# Patient Record
Sex: Male | Born: 1960 | Race: White | Hispanic: No | Marital: Married | Smoking: Never smoker
Health system: Southern US, Community
[De-identification: ages and names within clinical notes are randomized; demographics above are authoritative.]

## PROBLEM LIST (undated history)

## (undated) DIAGNOSIS — R1011 Right upper quadrant pain: Secondary | ICD-10-CM

## (undated) DIAGNOSIS — E669 Obesity, unspecified: Secondary | ICD-10-CM

## (undated) DIAGNOSIS — I8390 Asymptomatic varicose veins of unspecified lower extremity: Principal | ICD-10-CM

## (undated) DIAGNOSIS — B019 Varicella without complication: Secondary | ICD-10-CM

## (undated) DIAGNOSIS — Z8619 Personal history of other infectious and parasitic diseases: Secondary | ICD-10-CM

## (undated) DIAGNOSIS — E785 Hyperlipidemia, unspecified: Secondary | ICD-10-CM

## (undated) DIAGNOSIS — Z0000 Encounter for general adult medical examination without abnormal findings: Secondary | ICD-10-CM

## (undated) HISTORY — DX: Asymptomatic varicose veins of unspecified lower extremity: I83.90

## (undated) HISTORY — DX: Personal history of other infectious and parasitic diseases: Z86.19

## (undated) HISTORY — DX: Hyperlipidemia, unspecified: E78.5

## (undated) HISTORY — DX: Right upper quadrant pain: R10.11

## (undated) HISTORY — DX: Encounter for general adult medical examination without abnormal findings: Z00.00

## (undated) HISTORY — DX: Obesity, unspecified: E66.9

## (undated) HISTORY — DX: Varicella without complication: B01.9

---
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
## 1987-02-28 HISTORY — PX: WISDOM TOOTH EXTRACTION: SHX21

## 2013-12-23 ENCOUNTER — Encounter: Payer: Self-pay | Admitting: Family Medicine

## 2013-12-23 ENCOUNTER — Ambulatory Visit (INDEPENDENT_AMBULATORY_CARE_PROVIDER_SITE_OTHER): Payer: 59 | Admitting: Family Medicine

## 2013-12-23 VITALS — BP 116/63 | HR 55 | Temp 98.2°F | Ht 76.0 in | Wt 261.2 lb

## 2013-12-23 DIAGNOSIS — R1011 Right upper quadrant pain: Secondary | ICD-10-CM

## 2013-12-23 DIAGNOSIS — I8390 Asymptomatic varicose veins of unspecified lower extremity: Secondary | ICD-10-CM

## 2013-12-23 DIAGNOSIS — E785 Hyperlipidemia, unspecified: Secondary | ICD-10-CM

## 2013-12-23 DIAGNOSIS — Z1211 Encounter for screening for malignant neoplasm of colon: Secondary | ICD-10-CM

## 2013-12-23 DIAGNOSIS — Z8619 Personal history of other infectious and parasitic diseases: Secondary | ICD-10-CM | POA: Insufficient documentation

## 2013-12-23 DIAGNOSIS — Z832 Family history of diseases of the blood and blood-forming organs and certain disorders involving the immune mechanism: Secondary | ICD-10-CM

## 2013-12-23 DIAGNOSIS — Z0000 Encounter for general adult medical examination without abnormal findings: Secondary | ICD-10-CM

## 2013-12-23 DIAGNOSIS — I868 Varicose veins of other specified sites: Secondary | ICD-10-CM

## 2013-12-23 DIAGNOSIS — E669 Obesity, unspecified: Secondary | ICD-10-CM

## 2013-12-23 DIAGNOSIS — R7989 Other specified abnormal findings of blood chemistry: Secondary | ICD-10-CM

## 2013-12-23 DIAGNOSIS — R946 Abnormal results of thyroid function studies: Secondary | ICD-10-CM

## 2013-12-23 HISTORY — DX: Asymptomatic varicose veins of unspecified lower extremity: I83.90

## 2013-12-23 HISTORY — DX: Obesity, unspecified: E66.9

## 2013-12-23 HISTORY — DX: Right upper quadrant pain: R10.11

## 2013-12-23 HISTORY — DX: Hyperlipidemia, unspecified: E78.5

## 2013-12-23 HISTORY — DX: Encounter for general adult medical examination without abnormal findings: Z00.00

## 2013-12-23 LAB — TSH: TSH: 2.48 u[IU]/mL (ref 0.35–4.50)

## 2013-12-23 LAB — T4, FREE: Free T4: 1.01 ng/dL (ref 0.60–1.60)

## 2013-12-23 NOTE — Patient Instructions (Signed)
[place]  Call if abdominal pain worsens  Preventive Care for Adults A healthy lifestyle and preventive care can promote health and wellness. Preventive health guidelines for men include the following key practices:  A routine yearly physical is a good way to check with your health care provider about your health and preventative screening. It is a chance to share any concerns and updates on your health and to receive a thorough exam.  Visit your dentist for a routine exam and preventative care every 6 months. Brush your teeth twice a day and floss once a day. Good oral hygiene prevents tooth decay and gum disease.  The frequency of eye exams is based on your age, health, family medical history, use of contact lenses, and other factors. Follow your health care provider's recommendations for frequency of eye exams.  Eat a healthy diet. Foods such as vegetables, fruits, whole grains, low-fat dairy products, and lean protein foods contain the nutrients you need without too many calories. Decrease your intake of foods high in solid fats, added sugars, and salt. Eat the right amount of calories for you.Get information about a proper diet from your health care provider, if necessary.  Regular physical exercise is one of the most important things you can do for your health. Most adults should get at least 150 minutes of moderate-intensity exercise (any activity that increases your heart rate and causes you to sweat) each week. In addition, most adults need muscle-strengthening exercises on 2 or more days a week.  Maintain a healthy weight. The body mass index (BMI) is a screening tool to identify possible weight problems. It provides an estimate of body fat based on height and weight. Your health care provider can find your BMI and can help you achieve or maintain a healthy weight.For adults 20 years and older:  A BMI below 18.5 is considered underweight.  A BMI of 18.5 to 24.9 is  normal.  A BMI of 25 to 29.9 is considered overweight.  A BMI of 30 and above is considered obese.  Maintain normal blood lipids and cholesterol levels by exercising and minimizing your intake of saturated fat. Eat a balanced diet with plenty of fruit and vegetables. Blood tests for lipids and cholesterol should begin at age 60 and be repeated every 5 years. If your lipid or cholesterol levels are high, you are over 50, or you are at high risk for heart disease, you may need your cholesterol levels checked more frequently.Ongoing high lipid and cholesterol levels should be treated with medicines if diet and exercise are not working.  If you smoke, find out from your health care provider how to quit. If you do not use tobacco, do not start.  Lung cancer screening is recommended for adults aged 62-80 years who are at high risk for developing lung cancer because of a history of smoking. A yearly low-dose CT scan of the lungs is recommended for people who have at least a 30-pack-year history of smoking and are a current smoker or have quit within the past 15 years. A pack year of smoking is smoking an average of 1 pack of cigarettes a day for 1 year (for example: 1 pack a day for 30 years or 2 packs a day for 15 years). Yearly screening should continue until the smoker has stopped smoking for at least 15 years. Yearly screening should be stopped for people who develop a health problem that would prevent them from having lung cancer treatment.  If  you choose to drink alcohol, do not have more than 2 drinks per day. One drink is considered to be 12 ounces (355 mL) of beer, 5 ounces (148 mL) of wine, or 1.5 ounces (44 mL) of liquor.  Avoid use of street drugs. Do not share needles with anyone. Ask for help if you need support or instructions about stopping the use of drugs.  High blood pressure causes heart disease and increases the risk of stroke. Your blood pressure should be checked at least every 1-2  years. Ongoing high blood pressure should be treated with medicines, if weight loss and exercise are not effective.  If you are 21-2 years old, ask your health care provider if you should take aspirin to prevent heart disease.  Diabetes screening involves taking a blood sample to check your fasting blood sugar level. This should be done once every 3 years, after age 59, if you are within normal weight and without risk factors for diabetes. Testing should be considered at a younger age or be carried out more frequently if you are overweight and have at least 1 risk factor for diabetes.  Colorectal cancer can be detected and often prevented. Most routine colorectal cancer screening begins at the age of 11 and continues through age 37. However, your health care provider may recommend screening at an earlier age if you have risk factors for colon cancer. On a yearly basis, your health care provider may provide home test kits to check for hidden blood in the stool. Use of a small camera at the end of a tube to directly examine the colon (sigmoidoscopy or colonoscopy) can detect the earliest forms of colorectal cancer. Talk to your health care provider about this at age 86, when routine screening begins. Direct exam of the colon should be repeated every 5-10 years through age 66, unless early forms of precancerous polyps or small growths are found.  People who are at an increased risk for hepatitis B should be screened for this virus. You are considered at high risk for hepatitis B if:  You were born in a country where hepatitis B occurs often. Talk with your health care provider about which countries are considered high risk.  Your parents were born in a high-risk country and you have not received a shot to protect against hepatitis B (hepatitis B vaccine).  You have HIV or AIDS.  You use needles to inject street drugs.  You live with, or have sex with, someone who has hepatitis B.  You are a man who  has sex with other men (MSM).  You get hemodialysis treatment.  You take certain medicines for conditions such as cancer, organ transplantation, and autoimmune conditions.  Hepatitis C blood testing is recommended for all people born from [birth year] through 1965 and any individual with known risks for hepatitis C.  Practice safe sex. Use condoms and avoid high-risk sexual practices to reduce the spread of sexually transmitted infections (STIs). STIs include gonorrhea, chlamydia, syphilis, trichomonas, herpes, HPV, and human immunodeficiency virus (HIV). Herpes, HIV, and HPV are viral illnesses that have no cure. They can result in disability, cancer, and death.  If you are at risk of being infected with HIV, it is recommended that you take a prescription medicine daily to prevent HIV infection. This is called preexposure prophylaxis (PrEP). You are considered at risk if:  You are a man who has sex with other men (MSM) and have other risk factors.  You are a heterosexual man, are  sexually active, and are at increased risk for HIV infection.  You take drugs by injection.  You are sexually active with a partner who has HIV.  Talk with your health care provider about whether you are at high risk of being infected with HIV. If you choose to begin PrEP, you should first be tested for HIV. You should then be tested every 3 months for as long as you are taking PrEP.  A one-time screening for abdominal aortic aneurysm (AAA) and surgical repair of large AAAs by ultrasound are recommended for men ages 58 to 32 years who are current or former smokers.  Healthy men should no longer receive prostate-specific antigen (PSA) blood tests as part of routine cancer screening. Talk with your health care provider about prostate cancer screening.  Testicular cancer screening is not recommended for adult males who have no symptoms. Screening includes self-exam, a health care provider exam, and other screening tests.  Consult with your health care provider about any symptoms you have or any concerns you have about testicular cancer.  Use sunscreen. Apply sunscreen liberally and repeatedly throughout the day. You should seek shade when your shadow is shorter than you. Protect yourself by wearing long sleeves, pants, a wide-brimmed hat, and sunglasses year round, whenever you are outdoors.  Once a month, do a whole-body skin exam, using a mirror to look at the skin on your back. Tell your health care provider about new moles, moles that have irregular borders, moles that are larger than a pencil eraser, or moles that have changed in shape or color.  Stay current with required vaccines (immunizations).  Influenza vaccine. All adults should be immunized every year.  Tetanus, diphtheria, and acellular pertussis (Td, Tdap) vaccine. An adult who has not previously received Tdap or who does not know his vaccine status should receive 1 dose of Tdap. This initial dose should be followed by tetanus and diphtheria toxoids (Td) booster doses every 10 years. Adults with an unknown or incomplete history of completing a 3-dose immunization series with Td-containing vaccines should begin or complete a primary immunization series including a Tdap dose. Adults should receive a Td booster every 10 years.  Varicella vaccine. An adult without evidence of immunity to varicella should receive 2 doses or a second dose if he has previously received 1 dose.  Human papillomavirus (HPV) vaccine. Males aged 49-21 years who have not received the vaccine previously should receive the 3-dose series. Males aged 22-26 years may be immunized. Immunization is recommended through the age of 57 years for any male who has sex with males and did not get any or all doses earlier. Immunization is recommended for any person with an immunocompromised condition through the age of 70 years if he did not get any or all doses earlier. During the 3-dose series, the  second dose should be obtained 4-8 weeks after the first dose. The third dose should be obtained 24 weeks after the first dose and 16 weeks after the second dose.  Zoster vaccine. One dose is recommended for adults aged 23 years or older unless certain conditions are present.  Measles, mumps, and rubella (MMR) vaccine. Adults born before [birth year] generally are considered immune to measles and mumps. Adults born in [birth year] or later should have 1 or more doses of MMR vaccine unless there is a contraindication to the vaccine or there is laboratory evidence of immunity to each of the three diseases. A routine second dose of MMR vaccine should be obtained at  least 28 days after the first dose for students attending postsecondary schools, health care workers, or international travelers. People who received inactivated measles vaccine or an unknown type of measles vaccine during 1963-1967 should receive 2 doses of MMR vaccine. People who received inactivated mumps vaccine or an unknown type of mumps vaccine before 1979 and are at high risk for mumps infection should consider immunization with 2 doses of MMR vaccine. Unvaccinated health care workers born before [birth year] who lack laboratory evidence of measles, mumps, or rubella immunity or laboratory confirmation of disease should consider measles and mumps immunization with 2 doses of MMR vaccine or rubella immunization with 1 dose of MMR vaccine.  Pneumococcal 13-valent conjugate (PCV13) vaccine. When indicated, a person who is uncertain of his immunization history and has no record of immunization should receive the PCV13 vaccine. An adult aged 52 years or older who has certain medical conditions and has not been previously immunized should receive 1 dose of PCV13 vaccine. This PCV13 should be followed with a dose of pneumococcal polysaccharide (PPSV23) vaccine. The PPSV23 vaccine dose should be obtained at least 8 weeks after the dose of PCV13 vaccine. An adult aged 74 years  or older who has certain medical conditions and previously received 1 or more doses of PPSV23 vaccine should receive 1 dose of PCV13. The PCV13 vaccine dose should be obtained 1 or more years after the last PPSV23 vaccine dose.  Pneumococcal polysaccharide (PPSV23) vaccine. When PCV13 is also indicated, PCV13 should be obtained first. All adults aged 104 years and older should be immunized. An adult younger than age 38 years who has certain medical conditions should be immunized. Any person who resides in a nursing home or long-term care facility should be immunized. An adult smoker should be immunized. People with an immunocompromised condition and certain other conditions should receive both PCV13 and PPSV23 vaccines. People with human immunodeficiency virus (HIV) infection should be immunized as soon as possible after diagnosis. Immunization during chemotherapy or radiation therapy should be avoided. Routine use of PPSV23 vaccine is not recommended for American Indians, [place] Natives, or people younger than 65 years unless there are medical conditions that require PPSV23 vaccine. When indicated, people who have unknown immunization and have no record of immunization should receive PPSV23 vaccine. One-time revaccination 5 years after the first dose of PPSV23 is recommended for people aged 19-64 years who have chronic kidney failure, nephrotic syndrome, asplenia, or immunocompromised conditions. People who received 1-2 doses of PPSV23 before age 56 years should receive another dose of PPSV23 vaccine at age 29 years or later if at least 5 years have passed since the previous dose. Doses of PPSV23 are not needed for people immunized with PPSV23 at or after age 12 years.  Meningococcal vaccine. Adults with asplenia or persistent complement component deficiencies should receive 2 doses of quadrivalent meningococcal conjugate (MenACWY-D) vaccine. The doses should be obtained at least 2 months apart. Microbiologists  working with certain meningococcal bacteria, [employer] recruits, people at risk during an outbreak, and people who travel to or live in countries with a high rate of meningitis should be immunized. A first-year college student up through age 83 years who is living in a residence hall should receive a dose if he did not receive a dose on or after his 16th birthday. Adults who have certain high-risk conditions should receive one or more doses of vaccine.  Hepatitis A vaccine. Adults who wish to be protected from this disease, have certain high-risk conditions, work  with hepatitis A-infected animals, work in hepatitis A research labs, or travel to or work in countries with a high rate of hepatitis A should be immunized. Adults who were previously unvaccinated and who anticipate close contact with an international adoptee during the first 60 days after arrival in the [place] from a country with a high rate of hepatitis A should be immunized.  Hepatitis B vaccine. Adults should be immunized if they wish to be protected from this disease, have certain high-risk conditions, may be exposed to blood or other infectious body fluids, are household contacts or sex partners of hepatitis B positive people, are clients or workers in certain care facilities, or travel to or work in countries with a high rate of hepatitis B.  Haemophilus influenzae type b (Hib) vaccine. A previously unvaccinated person with asplenia or sickle cell disease or having a scheduled splenectomy should receive 1 dose of Hib vaccine. Regardless of previous immunization, a recipient of a hematopoietic stem cell transplant should receive a 3-dose series 6-12 months after his successful transplant. Hib vaccine is not recommended for adults with HIV infection. Preventive Service / Frequency Ages 59 to 29  Blood pressure check.** / Every 1 to 2 years.  Lipid and cholesterol check.** / Every 5 years beginning at age 34.  Hepatitis C blood  test.** / For any individual with known risks for hepatitis C.  Skin self-exam. / Monthly.  Influenza vaccine. / Every year.  Tetanus, diphtheria, and acellular pertussis (Tdap, Td) vaccine.** / Consult your health care provider. 1 dose of Td every 10 years.  Varicella vaccine.** / Consult your health care provider.  HPV vaccine. / 3 doses over 6 months, if 45 or younger.  Measles, mumps, rubella (MMR) vaccine.** / You need at least 1 dose of MMR if you were born in 1957 or later. You may also need a second dose.  Pneumococcal 13-valent conjugate (PCV13) vaccine.** / Consult your health care provider.  Pneumococcal polysaccharide (PPSV23) vaccine.** / 1 to 2 doses if you smoke cigarettes or if you have certain conditions.  Meningococcal vaccine.** / 1 dose if you are age 50 to 64 years and a Market researcher living in a residence hall, or have one of several medical conditions. You may also need additional booster doses.  Hepatitis A vaccine.** / Consult your health care provider.  Hepatitis B vaccine.** / Consult your health care provider.  Haemophilus influenzae type b (Hib) vaccine.** / Consult your health care provider. Ages 30 to 32  Blood pressure check.** / Every 1 to 2 years.  Lipid and cholesterol check.** / Every 5 years beginning at age 69.  Lung cancer screening. / Every year if you are aged 66-80 years and have a 30-pack-year history of smoking and currently smoke or have quit within the past 15 years. Yearly screening is stopped once you have quit smoking for at least 15 years or develop a health problem that would prevent you from having lung cancer treatment.  Fecal occult blood test (FOBT) of stool. / Every year beginning at age 89 and continuing until age 48. You may not have to do this test if you get a colonoscopy every 10 years.  Flexible sigmoidoscopy** or colonoscopy.** / Every 5 years for a flexible sigmoidoscopy or every 10 years for a colonoscopy  beginning at age 20 and continuing until age 55.  Hepatitis C blood test.** / For all people born from [birth year] through 1965 and any individual with known risks for hepatitis  C.  Skin self-exam. / Monthly.  Influenza vaccine. / Every year.  Tetanus, diphtheria, and acellular pertussis (Tdap/Td) vaccine.** / Consult your health care provider. 1 dose of Td every 10 years.  Varicella vaccine.** / Consult your health care provider.  Zoster vaccine.** / 1 dose for adults aged 107 years or older.  Measles, mumps, rubella (MMR) vaccine.** / You need at least 1 dose of MMR if you were born in 1955 or later. You may also need a second dose.  Pneumococcal 13-valent conjugate (PCV13) vaccine.** / Consult your health care provider.  Pneumococcal polysaccharide (PPSV23) vaccine.** / 1 to 2 doses if you smoke cigarettes or if you have certain conditions.  Meningococcal vaccine.** / Consult your health care provider.  Hepatitis A vaccine.** / Consult your health care provider.  Hepatitis B vaccine.** / Consult your health care provider.  Haemophilus influenzae type b (Hib) vaccine.** / Consult your health care provider. Ages 50 and over  Blood pressure check.** / Every 1 to 2 years.  Lipid and cholesterol check.**/ Every 5 years beginning at age 31.  Lung cancer screening. / Every year if you are aged 28-80 years and have a 30-pack-year history of smoking and currently smoke or have quit within the past 15 years. Yearly screening is stopped once you have quit smoking for at least 15 years or develop a health problem that would prevent you from having lung cancer treatment.  Fecal occult blood test (FOBT) of stool. / Every year beginning at age 82 and continuing until age 59. You may not have to do this test if you get a colonoscopy every 10 years.  Flexible sigmoidoscopy** or colonoscopy.** / Every 5 years for a flexible sigmoidoscopy or every 10 years for a colonoscopy beginning at age 68 and  continuing until age 72.  Hepatitis C blood test.** / For all people born from [birth year] through 1963 and any individual with known risks for hepatitis C.  Abdominal aortic aneurysm (AAA) screening.** / A one-time screening for ages 75 to 3 years who are current or former smokers.  Skin self-exam. / Monthly.  Influenza vaccine. / Every year.  Tetanus, diphtheria, and acellular pertussis (Tdap/Td) vaccine.** / 1 dose of Td every 10 years.  Varicella vaccine.** / Consult your health care provider.  Zoster vaccine.** / 1 dose for adults aged 28 years or older.  Pneumococcal 13-valent conjugate (PCV13) vaccine.** / Consult your health care provider.  Pneumococcal polysaccharide (PPSV23) vaccine.** / 1 dose for all adults aged 29 years and older.  Meningococcal vaccine.** / Consult your health care provider.  Hepatitis A vaccine.** / Consult your health care provider.  Hepatitis B vaccine.** / Consult your health care provider.  Haemophilus influenzae type b (Hib) vaccine.** / Consult your health care provider. **Family history and personal history of risk and conditions may change your health care provider's recommendations. Document Released: 04/11/2001 Document Revised: 02/18/2013 Document Reviewed: 07/11/2010 [facility] Patient Information 2015 [place], [place]. This information is not intended to replace advice given to you by your health care provider. Make sure you discuss any questions you have with your health care provider.

## 2013-12-23 NOTE — Progress Notes (Signed)
Pre visit review using our clinic review tool, if applicable. No additional management support is needed unless otherwise documented below in the visit note. 

## 2013-12-26 ENCOUNTER — Ambulatory Visit (HOSPITAL_BASED_OUTPATIENT_CLINIC_OR_DEPARTMENT_OTHER)
Admission: RE | Admit: 2013-12-26 | Discharge: 2013-12-26 | Disposition: A | Discharge status: Home / Self Care | Payer: 59 | Source: Ambulatory Visit | Attending: Radiology | Admitting: Radiology

## 2013-12-26 ENCOUNTER — Telehealth: Payer: Self-pay

## 2013-12-26 DIAGNOSIS — R1011 Right upper quadrant pain: Secondary | ICD-10-CM | POA: Insufficient documentation

## 2013-12-26 LAB — FACTOR 5 LEIDEN

## 2013-12-26 NOTE — Telephone Encounter (Signed)
Message copied by Court JoyFREEMAN, Arlan Birks L on Fri Dec 26, 2013  3:27 PM ------      Message from: Lillia AbedSHIVES, DUSTIN T      Created: Fri Dec 26, 2013  3:25 PM       Did you call patient about US results?  ------

## 2013-12-26 NOTE — Telephone Encounter (Signed)
i will notify pt of results

## 2013-12-28 DIAGNOSIS — Z832 Family history of diseases of the blood and blood-forming organs and certain disorders involving the immune mechanism: Secondary | ICD-10-CM | POA: Insufficient documentation

## 2013-12-28 DIAGNOSIS — R7989 Other specified abnormal findings of blood chemistry: Secondary | ICD-10-CM | POA: Insufficient documentation

## 2013-12-28 DIAGNOSIS — Z1211 Encounter for screening for malignant neoplasm of colon: Secondary | ICD-10-CM | POA: Insufficient documentation

## 2013-12-28 NOTE — Progress Notes (Signed)
Elise BenneJohn C Morell [id number] 08/28/1960 12/28/2013      Progress Note-Follow Up  Subjective  Chief Complaint  Chief Complaint  Patient presents with  . Establish Care    new patient    HPI  Patient is a 53 year old male in today for routine medical care. In today to establish care. Is noting recent history of sharp RUQ pain after eating rich food. No n/v. No other acute c/o but is interested in getting tested for factor V Leiden def due to family history. Denies CP/palp/SOB/HA/congestion/fevers/GI or GU c/o. Taking meds as prescribed  Past Medical History  Diagnosis Date  . Chicken pox as a child  . Varicose veins 12/23/2013  . RUQ pain 12/23/2013  . Obesity 12/23/2013  . Hyperlipidemia 12/23/2013  . Preventative health care 12/23/2013    Past Surgical History  Procedure Laterality Date  . Appendectomy  1979  . Tonsillectomy  1969  . Wisdom tooth extraction  1989    Family History  Problem Relation Age of Onset  . Heart disease Mother   . Stroke Mother   . Arthritis Mother   . Factor V Leiden deficiency Mother   . Cancer Father     prostate  . Arthritis Father   . Factor V Leiden deficiency Sister   . Factor V Leiden deficiency Brother   . Deep vein thrombosis Brother   . Parkinson's disease Paternal Grandmother   . Factor V Leiden deficiency Sister   . Drug abuse Brother   . Factor V Leiden deficiency Brother   . Deep vein thrombosis Brother   . Deep vein thrombosis Brother   . Learning disabilities Maternal Aunt     History   Social History  . Marital Status: Married    Spouse Name: N/A    Number of Children: N/A  . Years of Education: N/A   Occupational History  . Not on file.   Social History Main Topics  . Smoking status: Never Smoker   . Smokeless tobacco: Never Used  . Alcohol Use: Yes     Comment: 6 beers weekly  . Drug Use: No  . Sexual Activity:    Partners: Female     Comment: lives with wife, works as an Merchandiser, retailnvestment Consultant, no  dietary restrictions   Other Topics Concern  . Not on file   Social History Narrative  . No narrative on file    No current outpatient prescriptions on file prior to visit.   No current facility-administered medications on file prior to visit.    No Known Allergies  Review of Systems  Review of Systems  Constitutional: Negative for fever and malaise/fatigue.  HENT: Negative for congestion.   Eyes: Negative for discharge.  Respiratory: Negative for shortness of breath.   Cardiovascular: Negative for chest pain, palpitations and leg swelling.  Gastrointestinal: Negative for nausea, abdominal pain and diarrhea.  Genitourinary: Negative for dysuria.  Musculoskeletal: Negative for falls.  Skin: Negative for rash.  Neurological: Negative for loss of consciousness and headaches.  Endo/Heme/Allergies: Negative for polydipsia.  Psychiatric/Behavioral: Negative for depression and suicidal ideas. The patient is not nervous/anxious and does not have insomnia.     Objective  BP 116/63 mmHg  Pulse 55  Temp(Src) 98.2 F (36.8 C) (Oral)  Ht 6\' 4"  (1.93 m)  Wt 261 lb 3.2 oz (118.48 kg)  BMI 31.81 kg/m2  SpO2 100%  Physical Exam  Physical Exam  Constitutional: He is oriented to person, place, and time and well-developed, well-nourished,  and in no distress. No distress.  HENT:  Head: Normocephalic and atraumatic.  Eyes: Conjunctivae are normal.  Neck: Neck supple. No thyromegaly present.  Cardiovascular: Normal rate, regular rhythm and normal heart sounds.   No murmur heard. Pulmonary/Chest: Effort normal and breath sounds normal. No respiratory distress.  Abdominal: He exhibits no distension and no mass. There is no tenderness.  Musculoskeletal: He exhibits no edema.  Neurological: He is alert and oriented to person, place, and time.  Skin: Skin is warm.  Psychiatric: Memory, affect and judgment normal.    Lab Results  Component Value Date   TSH 2.48 12/23/2013       Assessment & Plan  Obesity Encouraged DASH diet, decrease po intake and increase exercise as tolerated. Needs 7-8 hours of sleep nightly. Avoid trans fats, eat small, frequent meals every 4-5 hours with lean proteins, complex carbs and healthy fats. Minimize simple carbs, GMO foods.  FH: factor V Leiden mutation Patient tested at visit, negative  Hyperlipidemia Encouraged heart healthy diet, increase exercise, avoid trans fats, consider a krill oil cap daily  Varicose veins Largely asymptomatic, encouraged compression hose.   Screen for colon cancer Referred for screening colonoscopy  Abnormal thyroid blood test Asymptomatic will monitor  RUQ pain After rich meals, off and on for months. Ultrasound unremarkable if continues will need referral for further evaluation

## 2013-12-28 NOTE — Assessment & Plan Note (Signed)
Encouraged heart healthy diet, increase exercise, avoid trans fats, consider a krill oil cap daily 

## 2013-12-28 NOTE — Assessment & Plan Note (Signed)
Patient tested at visit, negative

## 2013-12-28 NOTE — Assessment & Plan Note (Signed)
Referred for screening colonoscopy 

## 2013-12-28 NOTE — Assessment & Plan Note (Signed)
Encouraged DASH diet, decrease po intake and increase exercise as tolerated. Needs 7-8 hours of sleep nightly. Avoid trans fats, eat small, frequent meals every 4-5 hours with lean proteins, complex carbs and healthy fats. Minimize simple carbs, GMO foods. 

## 2013-12-28 NOTE — Assessment & Plan Note (Signed)
Asymptomatic. will monitor

## 2013-12-28 NOTE — Assessment & Plan Note (Signed)
After rich meals, off and on for months. Ultrasound unremarkable if continues will need referral for further evaluation

## 2013-12-28 NOTE — Assessment & Plan Note (Signed)
Largely asymptomatic, encouraged compression hose.

## 2014-03-02 ENCOUNTER — Encounter: Payer: Self-pay | Admitting: Family Medicine

## 2014-04-08 ENCOUNTER — Encounter: Payer: Self-pay | Admitting: Physician Assistant

## 2014-04-08 ENCOUNTER — Ambulatory Visit (INDEPENDENT_AMBULATORY_CARE_PROVIDER_SITE_OTHER): Payer: 59 | Admitting: Physician Assistant

## 2014-04-08 VITALS — BP 125/73 | HR 53 | Temp 97.8°F | Resp 16 | Ht 76.0 in | Wt 263.1 lb

## 2014-04-08 DIAGNOSIS — R109 Unspecified abdominal pain: Secondary | ICD-10-CM | POA: Insufficient documentation

## 2014-04-08 DIAGNOSIS — R1010 Upper abdominal pain, unspecified: Secondary | ICD-10-CM

## 2014-04-08 DIAGNOSIS — R1011 Right upper quadrant pain: Secondary | ICD-10-CM

## 2014-04-08 DIAGNOSIS — G8929 Other chronic pain: Secondary | ICD-10-CM

## 2014-04-08 LAB — CBC WITH DIFFERENTIAL/PLATELET
BASOS PCT: 0.5 % (ref 0.0–3.0)
Basophils Absolute: 0 10*3/uL (ref 0.0–0.1)
EOS PCT: 1 % (ref 0.0–5.0)
Eosinophils Absolute: 0.1 10*3/uL (ref 0.0–0.7)
HCT: 46.8 % (ref 39.0–52.0)
Hemoglobin: 15.9 g/dL (ref 13.0–17.0)
LYMPHS ABS: 1.8 10*3/uL (ref 0.7–4.0)
Lymphocytes Relative: 19.1 % (ref 12.0–46.0)
MCHC: 33.9 g/dL (ref 30.0–36.0)
MCV: 91.6 fl (ref 78.0–100.0)
Monocytes Absolute: 0.6 10*3/uL (ref 0.1–1.0)
Monocytes Relative: 6.1 % (ref 3.0–12.0)
NEUTROS ABS: 6.8 10*3/uL (ref 1.4–7.7)
NEUTROS PCT: 73.3 % (ref 43.0–77.0)
Platelets: 293 10*3/uL (ref 150.0–400.0)
RBC: 5.11 Mil/uL (ref 4.22–5.81)
RDW: 13.9 % (ref 11.5–15.5)
WBC: 9.3 10*3/uL (ref 4.0–10.5)

## 2014-04-08 LAB — COMPREHENSIVE METABOLIC PANEL
ALBUMIN: 4.2 g/dL (ref 3.5–5.2)
ALK PHOS: 65 U/L (ref 39–117)
ALT: 29 U/L (ref 0–53)
AST: 18 U/L (ref 0–37)
BUN: 16 mg/dL (ref 6–23)
CO2: 28 mEq/L (ref 19–32)
Calcium: 9.3 mg/dL (ref 8.4–10.5)
Chloride: 106 mEq/L (ref 96–112)
Creatinine, Ser: 0.98 mg/dL (ref 0.40–1.50)
GFR: 85.5 mL/min (ref >60.00)
Glucose, Bld: 92 mg/dL (ref 70–99)
Potassium: 4.3 mEq/L (ref 3.5–5.1)
Sodium: 139 mEq/L (ref 135–145)
TOTAL PROTEIN: 7.2 g/dL (ref 6.0–8.3)
Total Bilirubin: 0.6 mg/dL (ref 0.2–1.2)

## 2014-04-08 LAB — URINALYSIS, ROUTINE W REFLEX MICROSCOPIC
BILIRUBIN URINE: NEGATIVE
Hgb urine dipstick: NEGATIVE
Ketones, ur: NEGATIVE
LEUKOCYTES UA: NEGATIVE
NITRITE: NEGATIVE
PH: 5.5 (ref 5.0–8.0)
RBC / HPF: NONE SEEN (ref 0–?)
Specific Gravity, Urine: >=1.03 — AB (ref 1.000–1.030)
Total Protein, Urine: NEGATIVE
Urine Glucose: NEGATIVE
Urobilinogen, UA: 0.2 (ref 0.0–1.0)
WBC, UA: NONE SEEN (ref 0–?)

## 2014-04-08 LAB — LIPASE: LIPASE: 13 U/L (ref 11.0–59.0)

## 2014-04-08 MED ORDER — MELOXICAM 15 MG PO TABS: 15.0000 mg | ORAL_TABLET | Freq: Every day | ORAL | Status: DC

## 2014-04-08 NOTE — Progress Notes (Signed)
   Patient presents to clinic today c/o right sided low back pain over the past week, present with ROM.  Denies trauma or injury.  Has noted some intermittent RUQ pain with meals that has been present since summer of last year.  Previous workup for this including US was unremarkable. Patient denies fever, chills or malaise. Has taken Advil with some relief in symptoms.  Past Medical History  Diagnosis Date  . Chicken pox as a child  . Varicose veins 12/23/2013  . RUQ pain 12/23/2013  . Obesity 12/23/2013  . Hyperlipidemia 12/23/2013  . Preventative health care 12/23/2013    No current outpatient prescriptions on file prior to visit.   No current facility-administered medications on file prior to visit.    No Known Allergies  Family History  Problem Relation Age of Onset  . Heart disease Mother   . Stroke Mother   . Arthritis Mother   . Factor V Leiden deficiency Mother   . Cancer Father     prostate  . Arthritis Father   . Factor V Leiden deficiency Sister   . Factor V Leiden deficiency Brother   . Deep vein thrombosis Brother   . Parkinson's disease Paternal Grandmother   . Factor V Leiden deficiency Sister   . Drug abuse Brother   . Factor V Leiden deficiency Brother   . Deep vein thrombosis Brother   . Deep vein thrombosis Brother   . Learning disabilities Maternal Aunt     History   Social History  . Marital Status: Married    Spouse Name: N/A  . Number of Children: N/A  . Years of Education: N/A   Social History Main Topics  . Smoking status: Never Smoker   . Smokeless tobacco: Never Used  . Alcohol Use: Yes     Comment: 6 beers weekly  . Drug Use: No  . 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 None   Social History Narrative   Review of Systems - See HPI.  All other ROS are negative.  BP 125/73 mmHg  Pulse 53  Temp(Src) 97.8 F (36.6  C) (Oral)  Resp 16  Ht 6\' 4"  (1.93 m)  Wt 263 lb 2 oz (119.353 kg)  BMI 32.04 kg/m2  SpO2 98%  Physical Exam  Constitutional: He is well-developed, well-nourished, and in no distress.  HENT:  Head: Normocephalic and atraumatic.  Cardiovascular: Normal rate, regular rhythm, normal heart sounds and intact distal pulses.   Pulmonary/Chest: Effort normal and breath sounds normal. No respiratory distress. He has no wheezes. He has no rales. He exhibits no tenderness.  Abdominal: Soft. Bowel sounds are normal. He exhibits no distension and no mass. There is no tenderness. There is no rebound, no guarding and no CVA tenderness.  Musculoskeletal:       Arms: Vitals reviewed.    Assessment/Plan: Right flank pain Suspect MSK in nature giving exam findings.  Rx Meloxicam daily with food.  Topical Aspercreme.  Avoid heavy lifting or overexertion.  Giving other complaints, will check CBC, CMP, Lipase and UA today.   Chronic RUQ pain Intermittent.  Previous workup unremarkable.  Will obtain labs and DG of abdomen today.  If all are negative, recommend referral to GI.

## 2014-04-08 NOTE — Assessment & Plan Note (Signed)
Suspect MSK in nature giving exam findings.  Rx Meloxicam daily with food.  Topical Aspercreme.  Avoid heavy lifting or overexertion.  Giving other complaints, will check CBC, CMP, Lipase and UA today.

## 2014-04-08 NOTE — Progress Notes (Signed)
Pre visit review using our clinic review tool, if applicable. No additional management support is needed unless otherwise documented below in the visit note/SLS  

## 2014-04-08 NOTE — Patient Instructions (Signed)
Please go to the lab for blood work.  I will call you with your results. Proceed downstairs to obtain x-ray.  This is to assess cause for this chronic upper abdominal pain. If negative, we will need to set you up with GI.  For the back pain, these seems to be muscular giving it hurts when you twist your torso.  Take the Mobic daily as directed.  Limit heavy lifting.  This should continue to resolve.

## 2014-04-08 NOTE — Assessment & Plan Note (Signed)
Intermittent.  Previous workup unremarkable.  Will obtain labs and DG of abdomen today.  If all are negative, recommend referral to GI.

## 2014-07-20 ENCOUNTER — Emergency Department (HOSPITAL_BASED_OUTPATIENT_CLINIC_OR_DEPARTMENT_OTHER)
Admission: EM | Admit: 2014-07-20 | Discharge: 2014-07-20 | Disposition: A | Discharge status: Home / Self Care | Payer: 59 | Attending: Emergency Medicine | Admitting: Emergency Medicine

## 2014-07-20 ENCOUNTER — Encounter (HOSPITAL_BASED_OUTPATIENT_CLINIC_OR_DEPARTMENT_OTHER): Payer: Self-pay | Admitting: Family Medicine

## 2014-07-20 ENCOUNTER — Emergency Department (HOSPITAL_BASED_OUTPATIENT_CLINIC_OR_DEPARTMENT_OTHER): Payer: 59

## 2014-07-20 DIAGNOSIS — K7689 Other specified diseases of liver: Secondary | ICD-10-CM | POA: Diagnosis not present

## 2014-07-20 DIAGNOSIS — Z8679 Personal history of other diseases of the circulatory system: Secondary | ICD-10-CM | POA: Insufficient documentation

## 2014-07-20 DIAGNOSIS — Z791 Long term (current) use of non-steroidal anti-inflammatories (NSAID): Secondary | ICD-10-CM | POA: Diagnosis not present

## 2014-07-20 DIAGNOSIS — E669 Obesity, unspecified: Secondary | ICD-10-CM | POA: Diagnosis not present

## 2014-07-20 DIAGNOSIS — Z8619 Personal history of other infectious and parasitic diseases: Secondary | ICD-10-CM | POA: Insufficient documentation

## 2014-07-20 DIAGNOSIS — R109 Unspecified abdominal pain: Secondary | ICD-10-CM

## 2014-07-20 DIAGNOSIS — R1011 Right upper quadrant pain: Secondary | ICD-10-CM | POA: Diagnosis present

## 2014-07-20 LAB — COMPREHENSIVE METABOLIC PANEL WITH GFR
ALT: 23 U/L (ref 17–63)
AST: 16 U/L (ref 15–41)
Albumin: 3.5 g/dL (ref 3.5–5.0)
Alkaline Phosphatase: 51 U/L (ref 38–126)
Anion gap: 4 — ABNORMAL LOW (ref 5–15)
BUN: 16 mg/dL (ref 6–20)
CO2: 26 mmol/L (ref 22–32)
Calcium: 8.2 mg/dL — ABNORMAL LOW (ref 8.9–10.3)
Chloride: 106 mmol/L (ref 101–111)
Creatinine, Ser: 1.04 mg/dL (ref 0.61–1.24)
GFR calc Af Amer: >60 mL/min
GFR calc non Af Amer: >60 mL/min
Glucose, Bld: 93 mg/dL (ref 65–99)
Potassium: 4 mmol/L (ref 3.5–5.1)
Sodium: 136 mmol/L (ref 135–145)
Total Bilirubin: 0.5 mg/dL (ref 0.3–1.2)
Total Protein: 6.2 g/dL — ABNORMAL LOW (ref 6.5–8.1)

## 2014-07-20 LAB — URINALYSIS, ROUTINE W REFLEX MICROSCOPIC
Bilirubin Urine: NEGATIVE
Glucose, UA: NEGATIVE mg/dL
Hgb urine dipstick: NEGATIVE
KETONES UR: NEGATIVE mg/dL
LEUKOCYTES UA: NEGATIVE
NITRITE: NEGATIVE
Protein, ur: NEGATIVE mg/dL
Specific Gravity, Urine: 1.041 — ABNORMAL HIGH (ref 1.005–1.030)
Urobilinogen, UA: 0.2 mg/dL (ref 0.0–1.0)
pH: 6 (ref 5.0–8.0)

## 2014-07-20 LAB — CBC WITH DIFFERENTIAL/PLATELET
BASOS PCT: 0 % (ref 0–1)
Basophils Absolute: 0 10*3/uL (ref 0.0–0.1)
Eosinophils Absolute: 0.2 10*3/uL (ref 0.0–0.7)
Eosinophils Relative: 2 % (ref 0–5)
HEMATOCRIT: 45.2 % (ref 39.0–52.0)
Hemoglobin: 15.3 g/dL (ref 13.0–17.0)
LYMPHS ABS: 2.1 10*3/uL (ref 0.7–4.0)
Lymphocytes Relative: 28 % (ref 12–46)
MCH: 31.3 pg (ref 26.0–34.0)
MCHC: 33.8 g/dL (ref 30.0–36.0)
MCV: 92.4 fL (ref 78.0–100.0)
MONOS PCT: 8 % (ref 3–12)
Monocytes Absolute: 0.6 10*3/uL (ref 0.1–1.0)
Neutro Abs: 4.8 10*3/uL (ref 1.7–7.7)
Neutrophils Relative %: 62 % (ref 43–77)
PLATELETS: 282 10*3/uL (ref 150–400)
RBC: 4.89 MIL/uL (ref 4.22–5.81)
RDW: 13.1 % (ref 11.5–15.5)
WBC: 7.7 10*3/uL (ref 4.0–10.5)

## 2014-07-20 LAB — LIPASE, BLOOD: Lipase: 48 U/L (ref 22–51)

## 2014-07-20 MED ORDER — ONDANSETRON HCL 4 MG/2ML IJ SOLN
4.0000 mg | Freq: Once | INTRAMUSCULAR | Status: AC
  Administered 2014-07-20: 4 mg via INTRAVENOUS
  Filled 2014-07-20: qty 2

## 2014-07-20 MED ORDER — IOHEXOL 300 MG/ML  SOLN
100.0000 mL | Freq: Once | INTRAMUSCULAR | Status: AC | PRN
  Administered 2014-07-20: 100 mL via INTRAVENOUS

## 2014-07-20 MED ORDER — MORPHINE SULFATE 4 MG/ML IJ SOLN
4.0000 mg | Freq: Once | INTRAMUSCULAR | Status: AC
  Administered 2014-07-20: 4 mg via INTRAVENOUS
  Filled 2014-07-20: qty 1

## 2014-07-20 MED ORDER — MORPHINE SULFATE 4 MG/ML IJ SOLN: 4.0000 mg | INTRAMUSCULAR | Status: DC | PRN

## 2014-07-20 MED ORDER — OXYCODONE-ACETAMINOPHEN 5-325 MG PO TABS: 1.0000 | ORAL_TABLET | ORAL | Status: DC | PRN

## 2014-07-20 MED ORDER — MORPHINE SULFATE 4 MG/ML IJ SOLN
4.0000 mg | Freq: Once | INTRAMUSCULAR | Status: AC
Start: 2014-07-20 — End: 2014-07-20
  Administered 2014-07-20: 4 mg via INTRAVENOUS
  Filled 2014-07-20: qty 1

## 2014-07-20 NOTE — Discharge Instructions (Signed)

## 2014-07-20 NOTE — ED Notes (Signed)
Patient preparing for discharge. 

## 2014-07-20 NOTE — ED Notes (Signed)
Pt c/o epigastric pain and RUQ pain x 5 days that is worse after eating. Denies fever, chills, n/v/d. Denies constipation. Reports he had an US to check gallbladder in December.

## 2014-07-20 NOTE — ED Notes (Signed)
Pt ambulates to BR without problem, no c/o pain

## 2014-07-20 NOTE — ED Provider Notes (Signed)
CSN: [id number]     Arrival date & time 07/20/14  1011 History   First MD Initiated Contact with Patient 07/20/14 1021     Chief Complaint  Patient presents with  . Abdominal Pain     (Consider location/radiation/quality/duration/timing/severity/associated sxs/prior Treatment) HPI Comments: Pt comes in with c/o ruq and mid abdominal pain for the last 5 days. Has had some diarrhea. No n/v or fever. Worse with eating. Hasn't taken anything for the symptoms. Had his gallbladder checked in December for similar symptoms and nothing was found. Had appendectomy. No cp or sob  The history is provided by the patient. No language interpreter was used.    Past Medical History  Diagnosis Date  . Chicken pox as a child  . Varicose veins 12/23/2013  . RUQ pain 12/23/2013  . Obesity 12/23/2013  . Hyperlipidemia 12/23/2013  . Preventative health care 12/23/2013   Past Surgical History  Procedure Laterality Date  . Appendectomy  1979  . Tonsillectomy  1969  . Wisdom tooth extraction  1989   Family History  Problem Relation Age of Onset  . Heart disease Mother   . Stroke Mother   . Arthritis Mother   . Factor V Leiden deficiency Mother   . Cancer Father     prostate  . Arthritis Father   . Factor V Leiden deficiency Sister   . Factor V Leiden deficiency Brother   . Deep vein thrombosis Brother   . Parkinson's disease Paternal Grandmother   . Factor V Leiden deficiency Sister   . Drug abuse Brother   . Factor V Leiden deficiency Brother   . Deep vein thrombosis Brother   . Deep vein thrombosis Brother   . Learning disabilities Maternal Aunt    History  Substance Use Topics  . Smoking status: Never Smoker   . Smokeless tobacco: Never Used  . Alcohol Use: Yes     Comment: 6 beers weekly    Review of Systems  All other systems reviewed and are negative.     Allergies  Review of patient's allergies indicates no known allergies.  Home Medications   Prior to Admission  medications   Medication Sig Start Date End Date Taking? Authorizing Provider  meloxicam (MOBIC) 15 MG tablet Take 1 tablet (15 mg total) by mouth daily. 04/08/14   Waldon Merl, PA-C   BP 133/79 mmHg  Pulse 58  Temp(Src) 98.2 F (36.8 C) (Oral)  Resp 16  Ht  (1.93 m)  Wt 255 lb (115.667 kg)  BMI 31.05 kg/m2  SpO2 98% Physical Exam  Constitutional: He is oriented to person, place, and time. He appears well-developed and well-nourished.  HENT:  Head: Normocephalic and atraumatic.  Cardiovascular: Normal rate and regular rhythm.   Pulmonary/Chest: Effort normal and breath sounds normal.  Abdominal: Soft. Bowel sounds are normal. There is tenderness in the right lower quadrant.  Musculoskeletal: Normal range of motion.  Neurological: He is alert and oriented to person, place, and time.  Skin: Skin is warm and dry.  Nursing note and vitals reviewed.   ED Course  Procedures (including critical care time) Labs Review Labs Reviewed  COMPREHENSIVE METABOLIC PANEL - Abnormal; Notable for the following:    Calcium 8.2 (*)    Total Protein 6.2 (*)    Anion gap 4 (*)    All other components within normal limits  URINALYSIS, ROUTINE W REFLEX MICROSCOPIC - Abnormal; Notable for the following:    Specific Gravity, Urine 1.041 (*)  All other components within normal limits  CBC WITH DIFFERENTIAL/PLATELET  LIPASE, BLOOD    Imaging Review Ct Abdomen Pelvis W Contrast  07/20/2014   CLINICAL DATA:  Epigastric pain, right upper quadrant pain for 5 days, status post appendectomy  EXAM: CT ABDOMEN AND PELVIS WITH CONTRAST  TECHNIQUE: Multidetector CT imaging of the abdomen and pelvis was performed using the standard protocol following bolus administration of intravenous contrast.  CONTRAST:  100mL OMNIPAQUE IOHEXOL 300 MG/ML  SOLN  COMPARISON:  None.  FINDINGS: Sagittal images of the spine shows mild degenerative changes thoracolumbar spine. The lung bases are unremarkable. There is a  cyst in left hepatic dome measures 1.2 cm. A cyst in right hepatic lobe posteriorly measures 8.5 mm.  No intrahepatic biliary ductal dilatation. No calcified gallstones are noted within gallbladder. The pancreas, spleen and adrenal glands are unremarkable. Kidneys are symmetrical in size and enhancement. No hydronephrosis or hydroureter. No evidence of acute pancreatitis. Abdominal aorta and iliac arteries are unremarkable. No aortic aneurysm.  No small bowel obstruction. No ascites or free air. No adenopathy. Small nonspecific lymph nodes are noted in right lower quadrant mesentery. There is no pericecal inflammation. The patient is status post appendectomy. The terminal ileum is unremarkable.  No colitis or diverticulitis.  The urinary bladder is unremarkable. Prostate gland and seminal vesicles are unremarkable. Delayed renal images shows bilateral renal symmetrical excretion. Bilateral visualized ureter is unremarkable on delayed images.  IMPRESSION: 1. No acute inflammatory process within abdomen or pelvis. 2. A cyst in left hepatic dome of measures 1.2 cm. A cyst in right hepatic lobe measures 8.5 mm. No intrahepatic biliary ductal dilatation. 3. No hydronephrosis or hydroureter. 4. No pericecal inflammation.  Status post appendectomy. 5. No small bowel obstruction.  No colitis or diverticulitis.   Electronically Signed   By: Natasha MeadLiviu  Pop M.D.   On: 07/20/2014 11:43     EKG Interpretation None      MDM   Final diagnoses:  Abdominal pain, unspecified abdominal location  Hepatic cyst    No acute process to explain the symptoms. Discussed follow up with gi or pcp for continued symptoms. Given oxycodone for pain.    Teressa LowerVrinda Lakeia Bradshaw, NP 07/20/14 1317  Azalia BilisKevin Campos, MD 07/20/14 1348

## 2014-07-20 NOTE — ED Notes (Signed)
Pt verbalizes understanding of d/c instructions.  States feels much better, D[facility]ed [facility] as [facility]

## 2014-09-28 LAB — HM COLONOSCOPY

## 2014-11-30 IMAGING — US US ABDOMEN COMPLETE
1 series · 14 of 25 positions shown · non-contrast
Comparison: None.

CLINICAL DATA: Right upper quadrant pain after eating for 2-3
years.

EXAM:
ULTRASOUND ABDOMEN COMPLETE

[Series 1: us abdomen complete · 0.31mm/px · 14 of 60 slices shown]
[im 1/60]
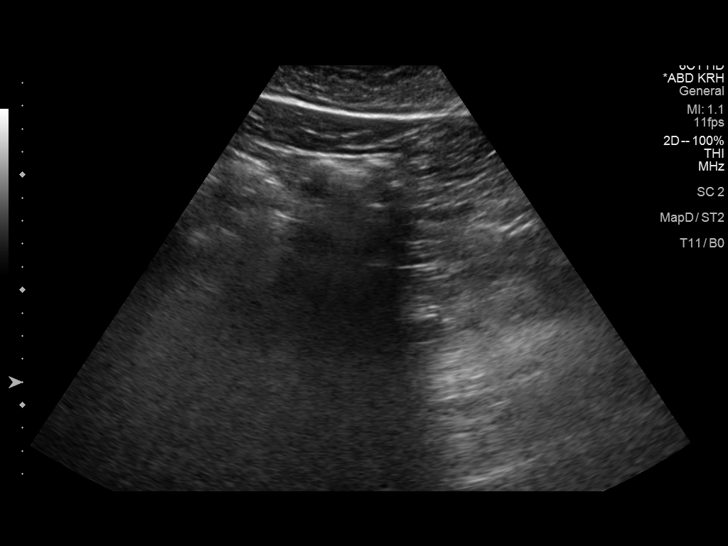
[im 5/60]
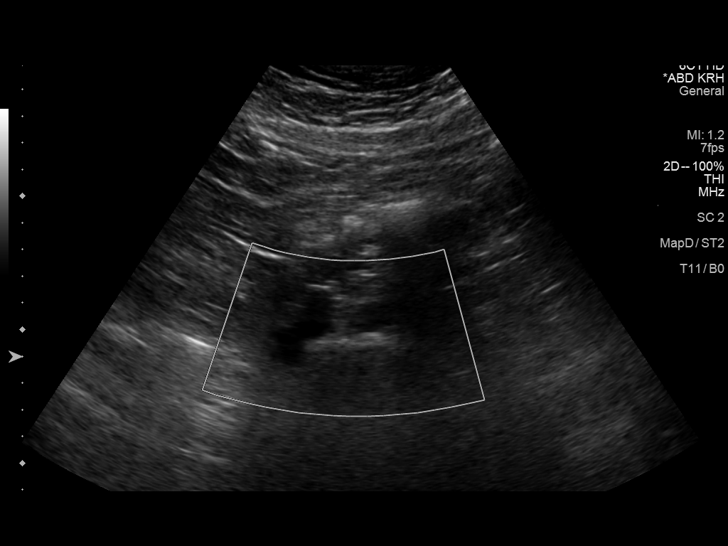
[im 10/60]
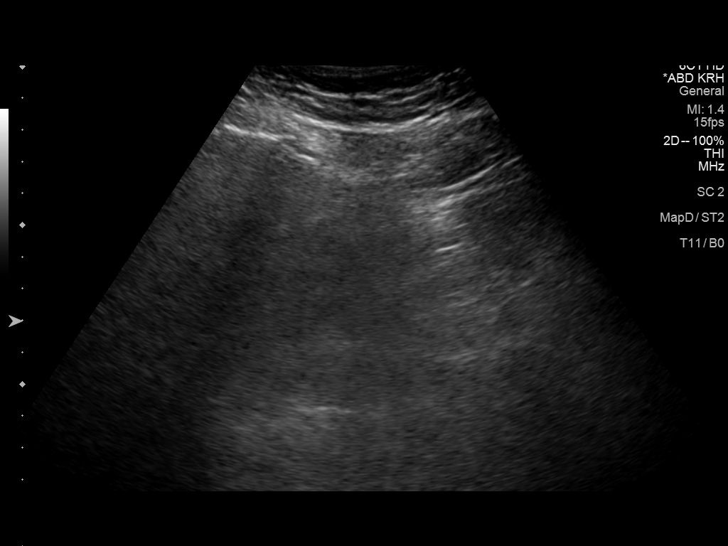
[im 15/60]
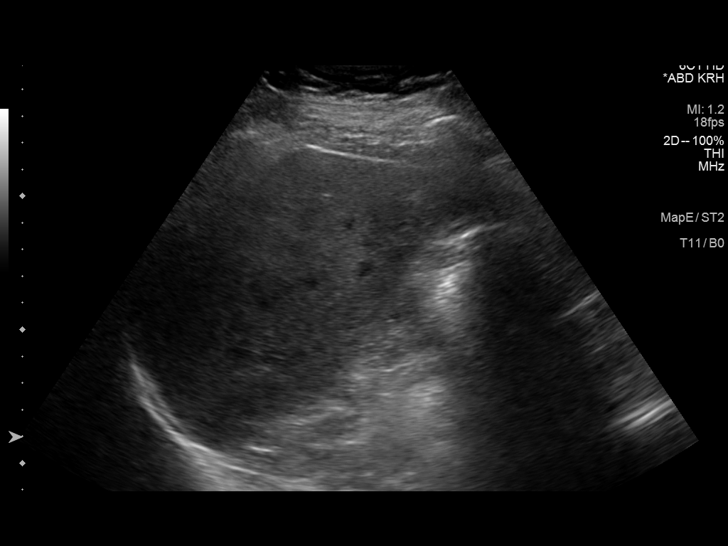
[im 20/60]
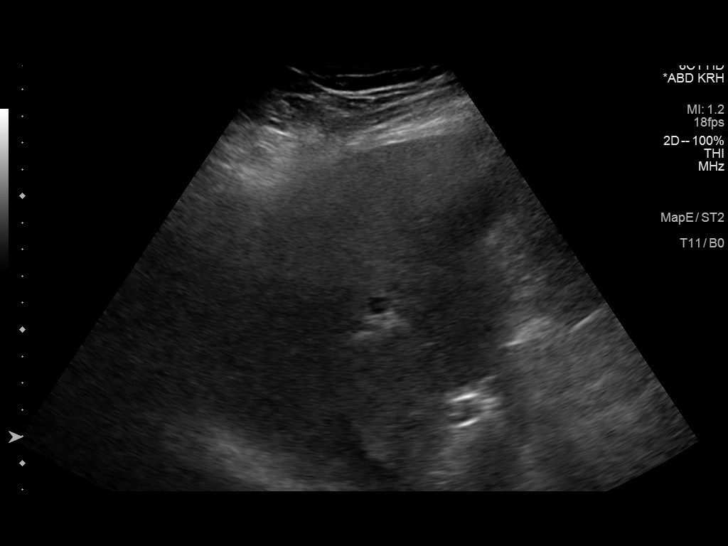
[im 23/60]
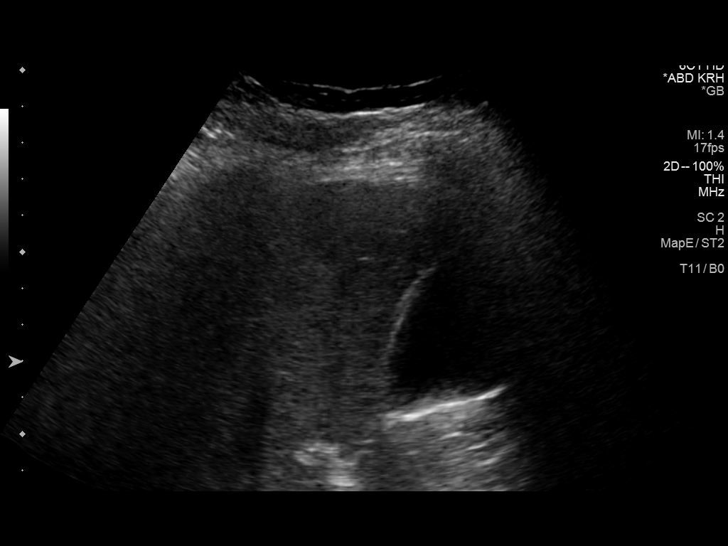
[im 28/60]
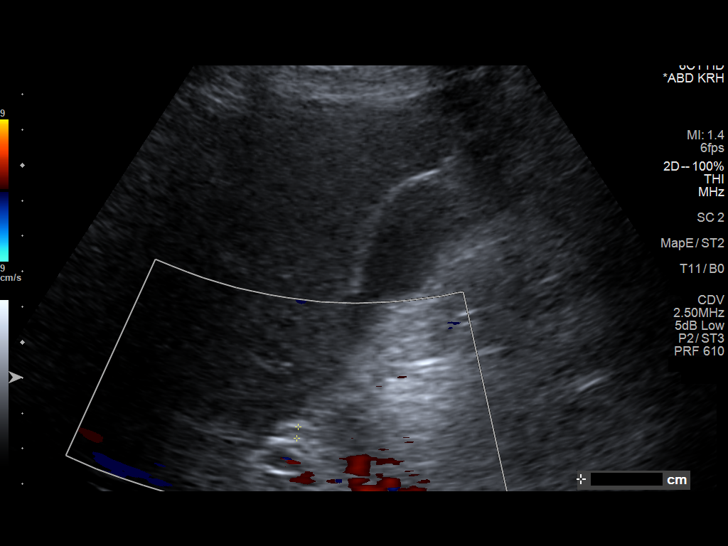
[im 32/60]
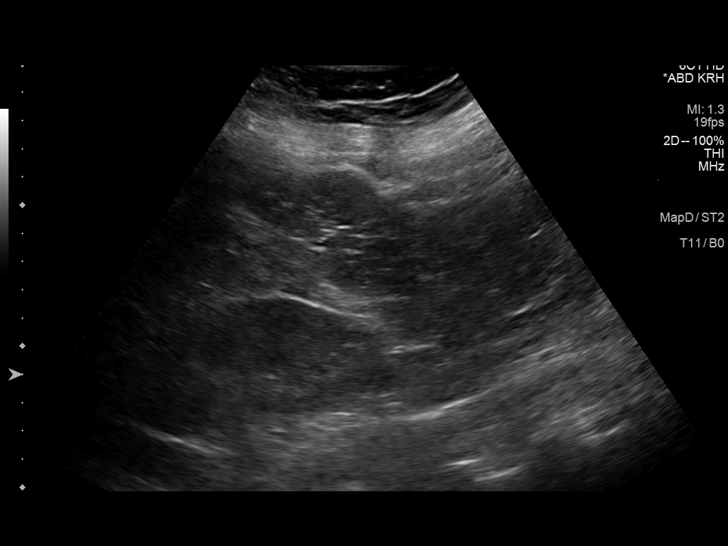
[im 37/60]
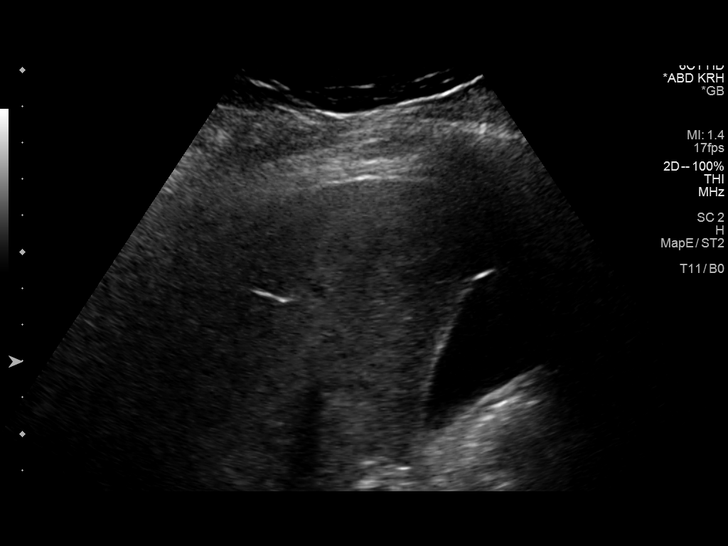
[im 40/60]
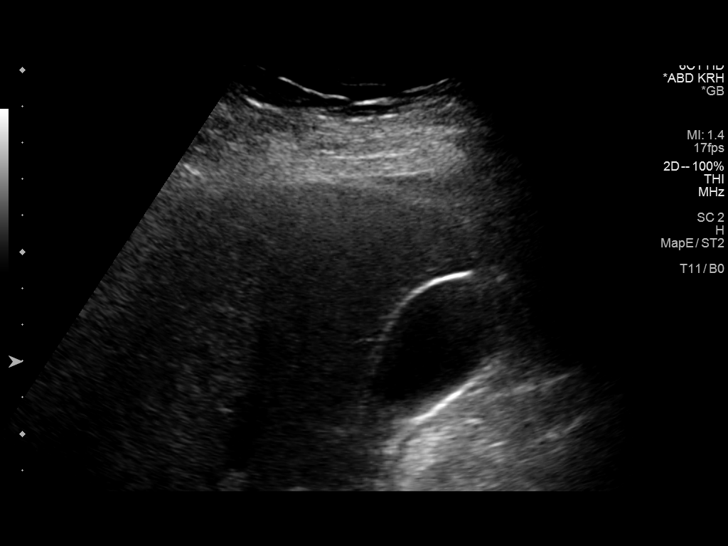
[im 45/60]
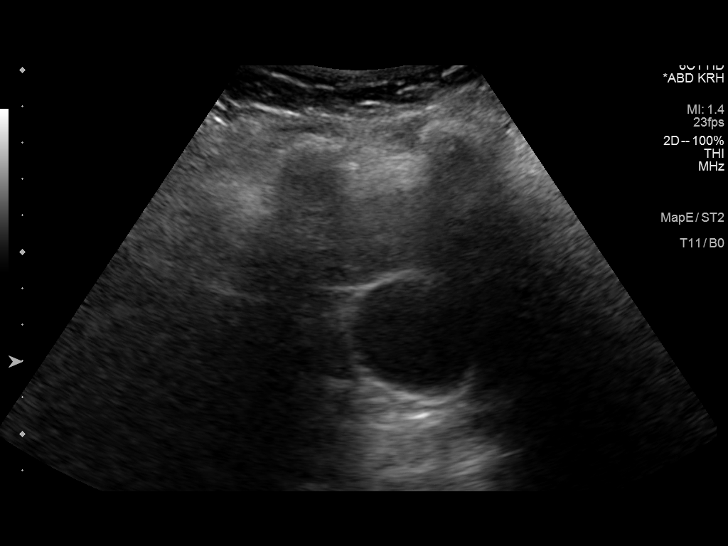
[im 50/60]
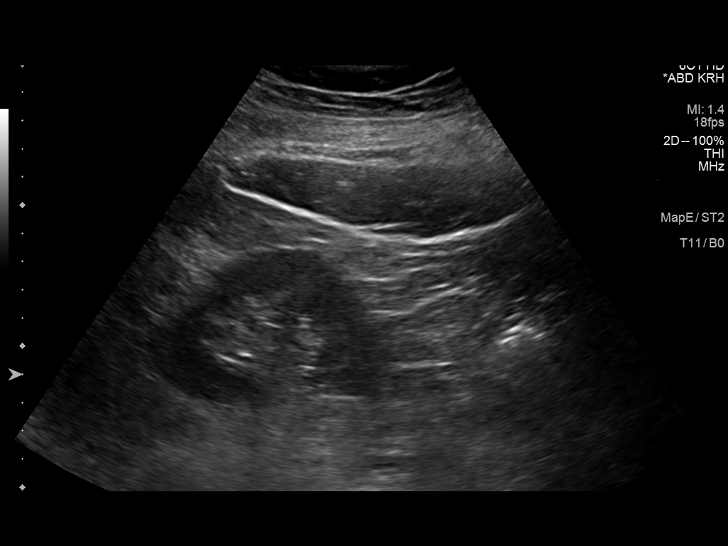
[im 55/60]
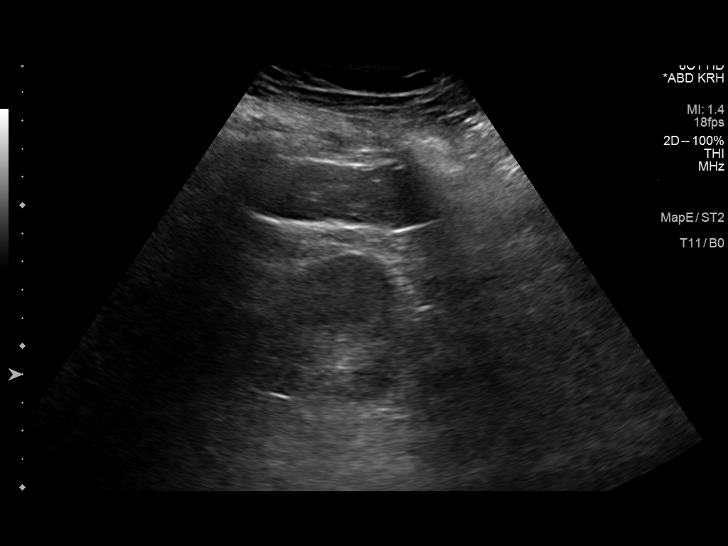
[im 60/60]
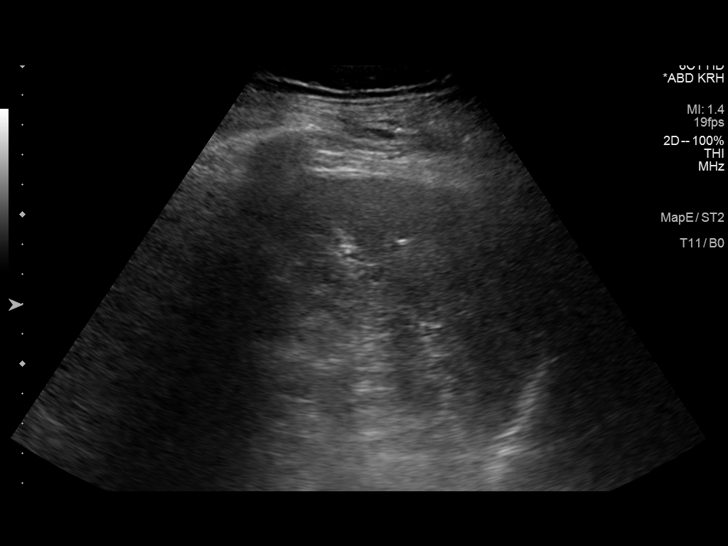

[14 of 25 positions shown; findings below may reference images not displayed]

FINDINGS: Abdominal organ and common bile duct evaluation was partially
limited by large amount of bowel gas and patient body habitus.

Gallbladder: No gallstones or wall thickening visualized. No
sonographic Murphy sign noted.

Common bile duct: Diameter: 3 mm

Liver: No focal lesion identified. Within normal limits in
parenchymal echogenicity.

IVC: No abnormality visualized.

Pancreas: Visualized portion unremarkable.

Spleen: Size and appearance within normal limits.

Right Kidney: Length: 10.6 cm. Echogenicity within normal limits. No
mass or hydronephrosis visualized.

Left Kidney: Length: 11.6 cm. Echogenicity within normal limits. No
mass or hydronephrosis visualized.

Abdominal aorta: No aneurysm visualized.

Other findings: None.
IMPRESSION: Mildly limited examination as above. Normal appearance of the
gallbladder without evidence of gallstones.

## 2014-12-23 ENCOUNTER — Telehealth: Payer: Self-pay | Admitting: Behavioral Health

## 2014-12-23 NOTE — Telephone Encounter (Signed)
Unable to reach patient at time of Pre-Visit Call.  Left message for patient to return call when available.    

## 2014-12-24 ENCOUNTER — Telehealth: Payer: Self-pay | Admitting: Family Medicine

## 2014-12-24 NOTE — Telephone Encounter (Signed)
No charge is fine 

## 2014-12-24 NOTE — Telephone Encounter (Signed)
Patient states he does not remember scheduling appointment and he does not want to be charge $50. Patient states when he got home from work there was a voicemail left from the nurse but it was to late to call. Charge or no charge

## 2015-06-24 IMAGING — CT CT ABD-PELV W/ CM
2 of 5 series · 16 of 46 positions shown, 18 images · IV contrast (omnipaque)
Comparison: None.

CLINICAL DATA: Epigastric pain, right upper quadrant pain for 5
days, status post appendectomy

EXAM:
CT ABDOMEN AND PELVIS WITH CONTRAST
TECHNIQUE: Multidetector CT imaging of the abdomen and pelvis was performed
using the standard protocol following bolus administration of
intravenous contrast.
CONTRAST:  100mL OMNIPAQUE IOHEXOL 300 MG/ML  SOLN

[Series 2: abd/pelvis 5.0 b31f · axial · 0.79mm/px · z∈[-588,-173]mm · 13 of 97 slices shown, 15 images]
[im 7/97  soft-tissue]
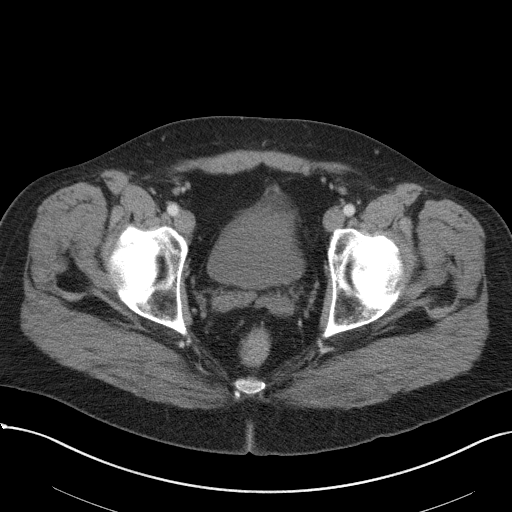
[im 7/97  bone]
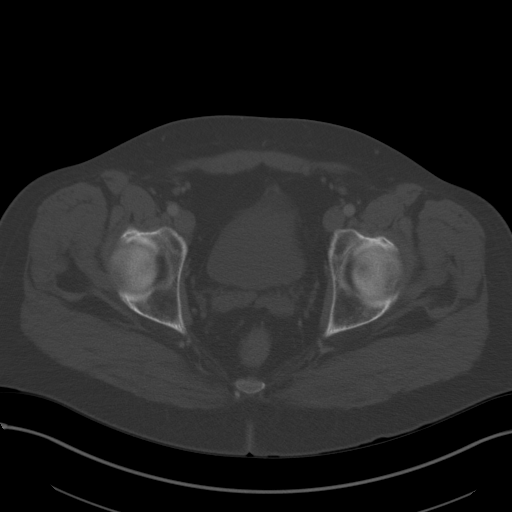
[im 14/97  soft-tissue]
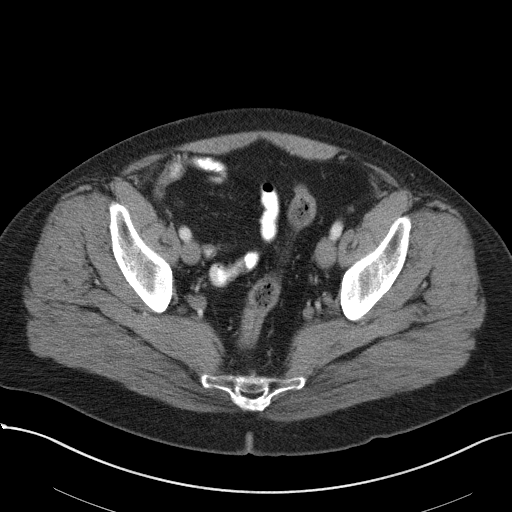
[im 21/97  soft-tissue]
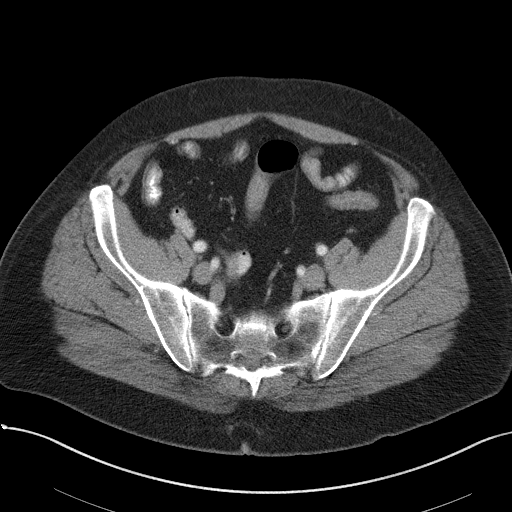
[im 28/97  soft-tissue]
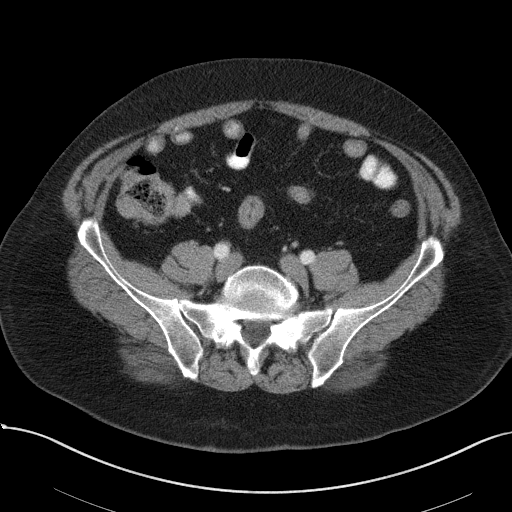
[im 35/97  soft-tissue]
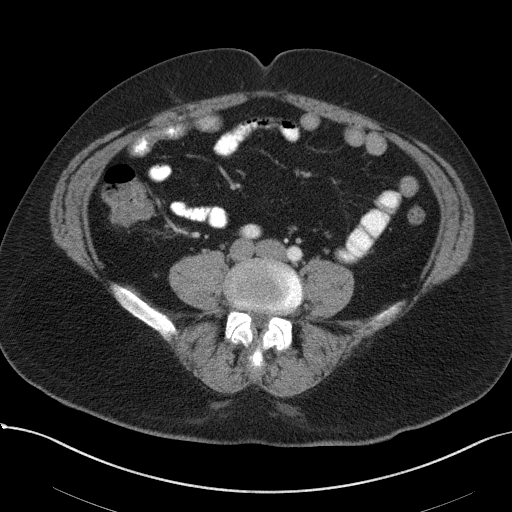
[im 42/97  soft-tissue]
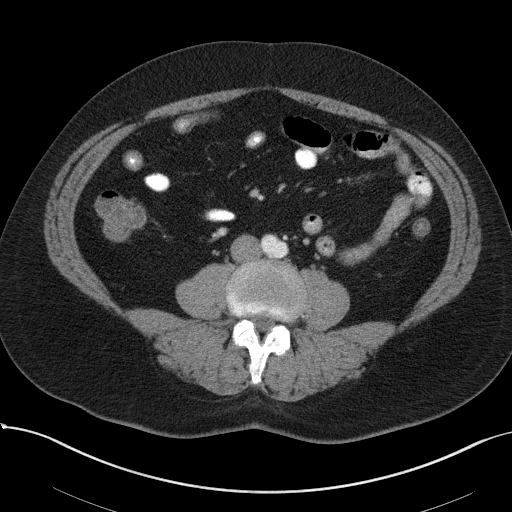
[im 49/97  soft-tissue]
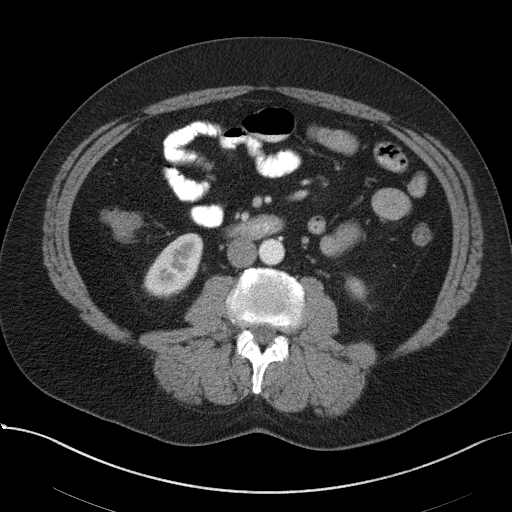
[im 55/97  soft-tissue]
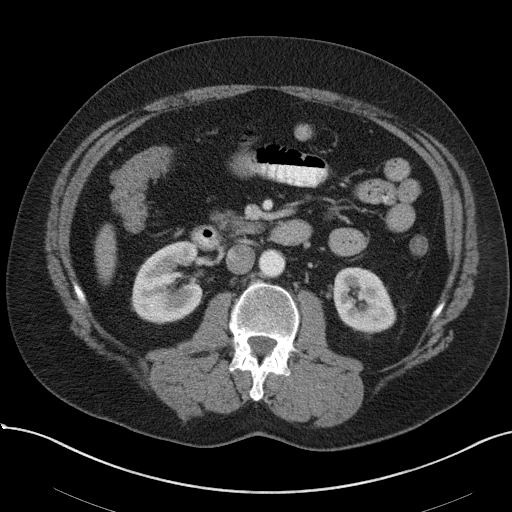
[im 62/97  soft-tissue]
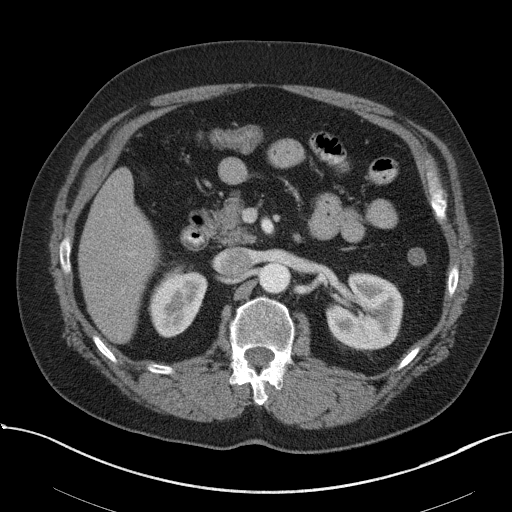
[im 62/97  bone]
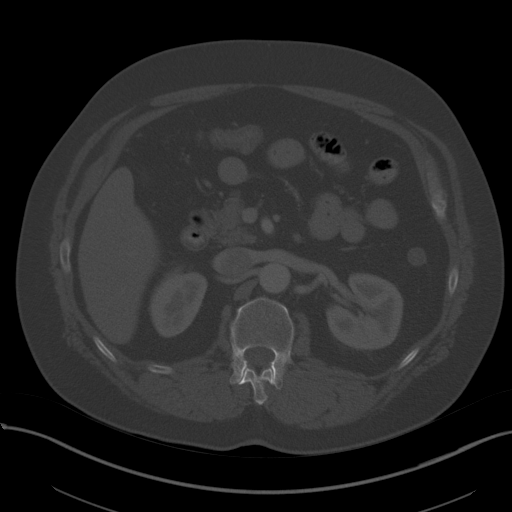
[im 69/97  soft-tissue]
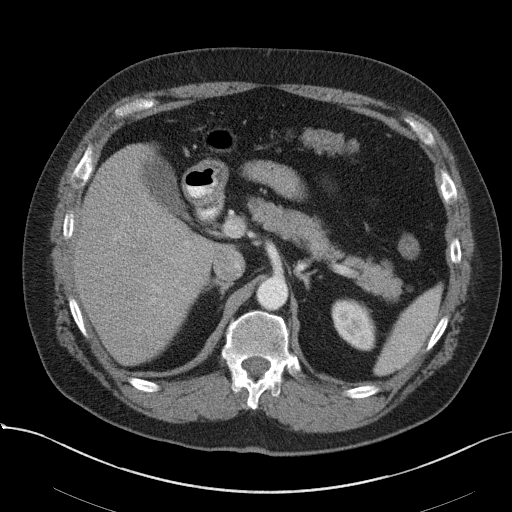
[im 76/97  soft-tissue]
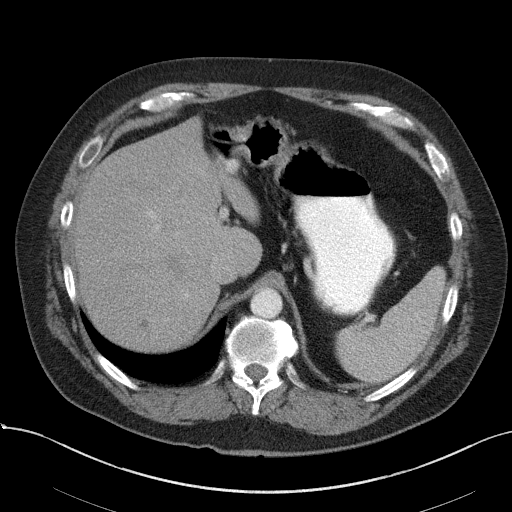
[im 83/97  soft-tissue]
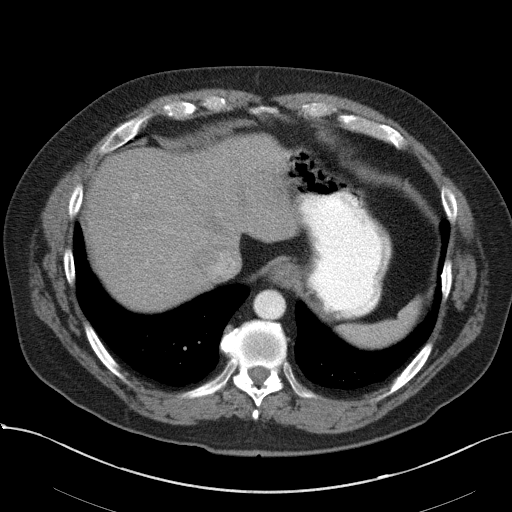
[im 90/97  soft-tissue]
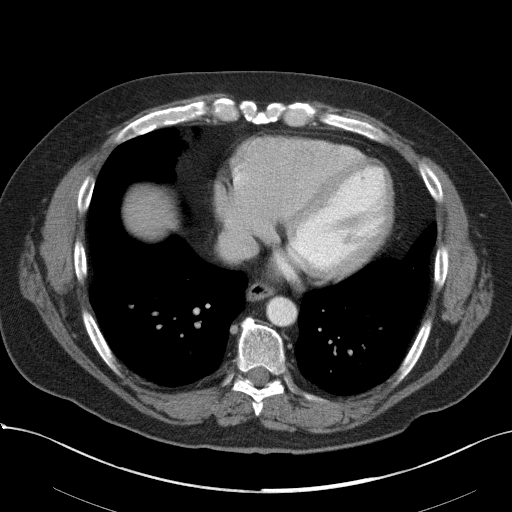

[Series 6: abd/pelvis 3.0 coronal · coronal · 1.12mm/px · 3 of 100 slices shown]
[im 34/100  soft-tissue]
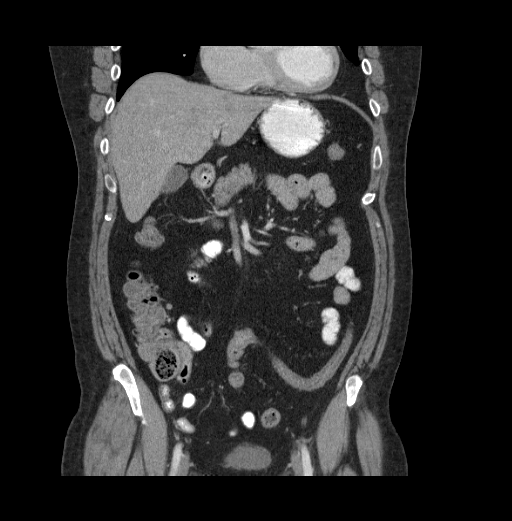
[im 45/100  soft-tissue]
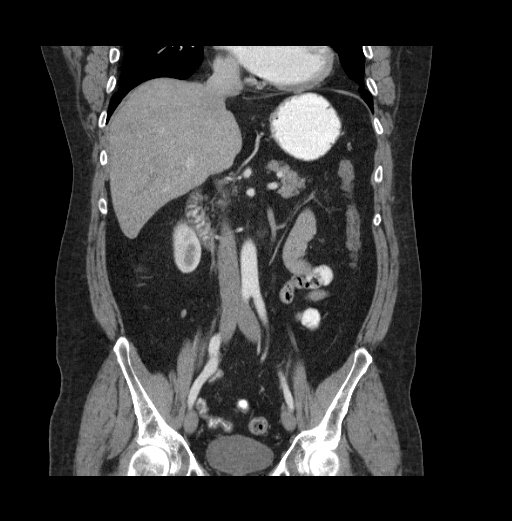
[im 56/100  soft-tissue]
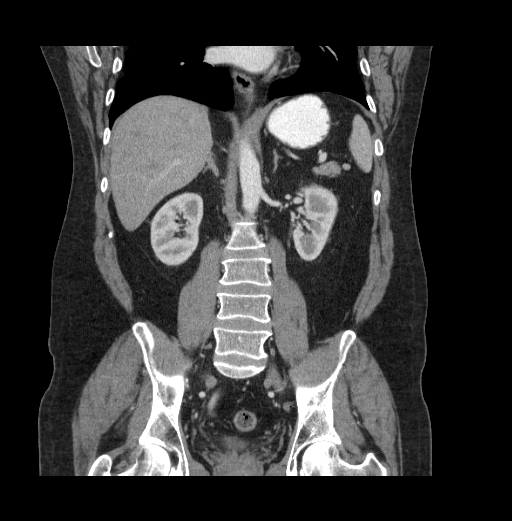

[16 of 46 positions shown; findings below may reference images not displayed]

FINDINGS: Sagittal images of the spine shows mild degenerative changes
thoracolumbar spine. The lung bases are unremarkable. There is a
cyst in left hepatic dome measures 1.2 cm. A cyst in right hepatic
lobe posteriorly measures 8.5 mm.

No intrahepatic biliary ductal dilatation. No calcified gallstones
are noted within gallbladder. The pancreas, spleen and adrenal
glands are unremarkable. Kidneys are symmetrical in size and
enhancement. No hydronephrosis or hydroureter. No evidence of acute
pancreatitis. Abdominal aorta and iliac arteries are unremarkable.
No aortic aneurysm.

No small bowel obstruction. No ascites or free air. No adenopathy.
Small nonspecific lymph nodes are noted in right lower quadrant
mesentery. There is no pericecal inflammation. The patient is status
post appendectomy. The terminal ileum is unremarkable.

No colitis or diverticulitis.

The urinary bladder is unremarkable. Prostate gland and seminal
vesicles are unremarkable. Delayed renal images shows bilateral
renal symmetrical excretion. Bilateral visualized ureter is
unremarkable on delayed images.
IMPRESSION: 1. No acute inflammatory process within abdomen or pelvis.
2. A cyst in left hepatic dome of measures 1.2 cm. A cyst in right
hepatic lobe measures 8.5 mm. No intrahepatic biliary ductal
dilatation.
3. No hydronephrosis or hydroureter.
4. No pericecal inflammation.  Status post appendectomy.
5. No small bowel obstruction.  No colitis or diverticulitis.

## 2015-12-21 ENCOUNTER — Ambulatory Visit (INDEPENDENT_AMBULATORY_CARE_PROVIDER_SITE_OTHER): Payer: 59 | Admitting: Family Medicine

## 2015-12-21 ENCOUNTER — Encounter: Payer: Self-pay | Admitting: Family Medicine

## 2015-12-21 VITALS — BP 120/72 | HR 72 | Temp 98.7°F | Ht 76.0 in | Wt 265.1 lb

## 2015-12-21 DIAGNOSIS — L03319 Cellulitis of trunk, unspecified: Secondary | ICD-10-CM

## 2015-12-21 DIAGNOSIS — L02219 Cutaneous abscess of trunk, unspecified: Secondary | ICD-10-CM | POA: Diagnosis not present

## 2015-12-21 MED ORDER — OXYCODONE-ACETAMINOPHEN 5-325 MG PO TABS: 1.0000 | ORAL_TABLET | Freq: Three times a day (TID) | ORAL | 0 refills | Status: DC | PRN

## 2015-12-21 MED ORDER — AMOXICILLIN-POT CLAVULANATE 875-125 MG PO TABS
1.0000 | ORAL_TABLET | Freq: Two times a day (BID) | ORAL | 0 refills | Status: DC
Start: 2015-12-21 — End: 2016-10-09

## 2015-12-21 NOTE — Patient Instructions (Signed)

## 2015-12-21 NOTE — Progress Notes (Signed)
Pre visit review using our clinic review tool, if applicable. No additional management support is needed unless otherwise documented below in the visit note. 

## 2015-12-22 DIAGNOSIS — L03319 Cellulitis of trunk, unspecified: Principal | ICD-10-CM

## 2015-12-22 DIAGNOSIS — L02219 Cutaneous abscess of trunk, unspecified: Secondary | ICD-10-CM | POA: Insufficient documentation

## 2015-12-22 NOTE — Progress Notes (Signed)
Patient ID: Jimmy Werner, male   DOB: 07-Aug-1960, 55 y.o.   MRN: [id number]   Subjective:    Patient ID: Jimmy Werner, male    DOB: 02-19-1961, 55 y.o.   MRN: [id number]  Chief Complaint  Patient presents with  . Lipoma    back    HPI Patient is in today for evaluation of a lesion on his back. Patient has had a large cystic structure asymptomatic for years. Then over the past several days it has become increasingly large and now is red, hot and painful. Patient endorses fatigue but no other systemic symptoms.. Denies CP/palp/SOB/HA/congestion/fevers/GI or GU c/o. Taking meds as prescribed  Past Medical History:  Diagnosis Date  . Chicken pox as a child  . Hyperlipidemia 12/23/2013  . Obesity 12/23/2013  . Preventative health care 12/23/2013  . RUQ pain 12/23/2013  . Varicose veins 12/23/2013    Past Surgical History:  Procedure Laterality Date  . APPENDECTOMY  1979  . TONSILLECTOMY  1969  . WISDOM TOOTH EXTRACTION  1989    Family History  Problem Relation Age of Onset  . Heart disease Mother   . Stroke Mother   . Arthritis Mother   . Factor V Leiden deficiency Mother   . Cancer Father     prostate  . Arthritis Father   . Factor V Leiden deficiency Sister   . Factor V Leiden deficiency Brother   . Deep vein thrombosis Brother   . Parkinson's disease Paternal Grandmother   . Factor V Leiden deficiency Sister   . Drug abuse Brother   . Factor V Leiden deficiency Brother   . Deep vein thrombosis Brother   . Deep vein thrombosis Brother   . Learning disabilities Maternal Aunt     Social History   Social History  . Marital status: Married    Spouse name: N/A  . Number of children: N/A  . Years of education: N/A   Occupational History  . Not on file.   Social History Main Topics  . Smoking status: Never Smoker  . Smokeless tobacco: Never Used  . Alcohol use Yes     Comment: 6 beers weekly  . Drug use: No  . 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 Not on file   Social History Narrative  . No narrative on file    Outpatient Medications Prior to Visit  Medication Sig Dispense Refill  . meloxicam (MOBIC) 15 MG tablet Take 1 tablet (15 mg total) by mouth daily. 30 tablet 0  . oxyCODONE-acetaminophen (PERCOCET/ROXICET) 5-325 MG per tablet Take 1-2 tablets by mouth every 4 (four) hours as needed for severe pain. 15 tablet 0   No facility-administered medications prior to visit.     No Known Allergies  Review of Systems  Constitutional: Positive for malaise/fatigue. Negative for fever.  HENT: Negative for congestion.   Eyes: Negative for blurred vision.  Respiratory: Negative for shortness of breath.   Cardiovascular: Negative for chest pain, palpitations and leg swelling.  Gastrointestinal: Negative for abdominal pain, blood in stool and nausea.  Genitourinary: Negative for dysuria and frequency.  Musculoskeletal: Positive for back pain and myalgias. Negative for falls.  Skin: Negative for rash.       Painful, raised lesion upper back.  Neurological: Negative for dizziness, loss of consciousness and headaches.  Endo/Heme/Allergies: Negative for environmental allergies.  Psychiatric/Behavioral:  Negative for depression. The patient is not nervous/anxious.        Objective:    Physical Exam  Constitutional: He is oriented to person, place, and time. He appears well-developed and well-nourished. No distress.  HENT:  Head: Normocephalic and atraumatic.  Nose: Nose normal.  Eyes: Right eye exhibits no discharge. Left eye exhibits no discharge.  Neck: Normal range of motion. Neck supple.  Cardiovascular: Normal rate and regular rhythm.   No murmur heard. Pulmonary/Chest: Effort normal and breath sounds normal.  Abdominal: Soft. Bowel sounds are normal. There is no tenderness.  Musculoskeletal: He exhibits no edema.   Neurological: He is alert and oriented to person, place, and time.  Skin: Skin is warm and dry.  3 -4 cm raised, fluctuance, red, tender and hot lesion on upper back.   Psychiatric: He has a normal mood and affect.  Nursing note and vitals reviewed.   BP 120/72 (BP Location: Left Arm, Patient Position: Sitting, Cuff Size: Large)   Pulse 72   Temp 98.7 F (37.1 C) (Oral)   Ht 6\' 4"  (1.93 m)   Wt 265 lb 2 oz (120.3 kg)   SpO2 95%   BMI 32.27 kg/m  Wt Readings from Last 3 Encounters:  12/21/15 265 lb 2 oz (120.3 kg)  07/20/14 255 lb (115.7 kg)  04/08/14 263 lb 2 oz (119.4 kg)     Lab Results  Component Value Date   WBC 7.7 07/20/2014   HGB 15.3 07/20/2014   HCT 45.2 07/20/2014   PLT 282 07/20/2014   GLUCOSE 93 07/20/2014   ALT 23 07/20/2014   AST 16 07/20/2014   NA 136 07/20/2014   K 4.0 07/20/2014   CL 106 07/20/2014   CREATININE 1.04 07/20/2014   BUN 16 07/20/2014   CO2 26 07/20/2014   TSH 2.48 12/23/2013    Lab Results  Component Value Date   TSH 2.48 12/23/2013   Lab Results  Component Value Date   WBC 7.7 07/20/2014   HGB 15.3 07/20/2014   HCT 45.2 07/20/2014   MCV 92.4 07/20/2014   PLT 282 07/20/2014   Lab Results  Component Value Date   NA 136 07/20/2014   K 4.0 07/20/2014   CO2 26 07/20/2014   GLUCOSE 93 07/20/2014   BUN 16 07/20/2014   CREATININE 1.04 07/20/2014   BILITOT 0.5 07/20/2014   ALKPHOS 51 07/20/2014   AST 16 07/20/2014   ALT 23 07/20/2014   PROT 6.2 (L) 07/20/2014   ALBUMIN 3.5 07/20/2014   CALCIUM 8.2 (L) 07/20/2014   ANIONGAP 4 (L) 07/20/2014   GFR 85.50 04/08/2014   No results found for: CHOL No results found for: HDL No results found for: LDLCALC No results found for: TRIG No results found for: CHOLHDL No results found for: [id number]     Assessment & Plan:   Problem List Items Addressed This Visit    Cellulitis and abscess of trunk - Primary    Patient has had a large cystic structure asymptomatic for years. Then  over the past several days it has become increasingly large and now is red, hot and painful. Patient endorses fatigue but no other systemic symptoms. He is started on Augmentin and given Percocet for pain. Set up with general surgery for I and D.       Relevant Orders   Ambulatory referral to General Surgery    Other Visit Diagnoses   None.     I have discontinued Mr. Samudio's meloxicam and oxyCODONE-acetaminophen.  I am also having him start on oxyCODONE-acetaminophen and amoxicillin-clavulanate.  Meds ordered this encounter  Medications  . oxyCODONE-acetaminophen (ROXICET) 5-325 MG tablet    Sig: Take 1 tablet by mouth every 8 (eight) hours as needed for severe pain.    Dispense:  20 tablet    Refill:  0  . amoxicillin-clavulanate (AUGMENTIN) 875-125 MG tablet    Sig: Take 1 tablet by mouth 2 (two) times daily.    Dispense:  20 tablet    Refill:  0     Danise Edge, MD

## 2015-12-22 NOTE — Assessment & Plan Note (Addendum)
Patient has had a large cystic structure asymptomatic for years. Then over the past several days it has become increasingly large and now is red, hot and painful. Patient endorses fatigue but no other systemic symptoms. He is started on Augmentin and given Percocet for pain. Set up with general surgery for I and D.

## 2016-06-20 ENCOUNTER — Encounter: Payer: 59 | Admitting: Family Medicine

## 2016-07-11 ENCOUNTER — Encounter: Payer: 59 | Admitting: Family Medicine

## 2016-10-09 ENCOUNTER — Ambulatory Visit (INDEPENDENT_AMBULATORY_CARE_PROVIDER_SITE_OTHER): Payer: BLUE CROSS/BLUE SHIELD | Admitting: Family Medicine

## 2016-10-09 ENCOUNTER — Encounter: Payer: Self-pay | Admitting: Family Medicine

## 2016-10-09 ENCOUNTER — Encounter (INDEPENDENT_AMBULATORY_CARE_PROVIDER_SITE_OTHER): Payer: Self-pay

## 2016-10-09 DIAGNOSIS — E782 Mixed hyperlipidemia: Secondary | ICD-10-CM | POA: Diagnosis not present

## 2016-10-09 DIAGNOSIS — G8929 Other chronic pain: Secondary | ICD-10-CM | POA: Diagnosis not present

## 2016-10-09 DIAGNOSIS — Z0000 Encounter for general adult medical examination without abnormal findings: Secondary | ICD-10-CM

## 2016-10-09 DIAGNOSIS — E860 Dehydration: Secondary | ICD-10-CM | POA: Diagnosis not present

## 2016-10-09 DIAGNOSIS — R1011 Right upper quadrant pain: Secondary | ICD-10-CM

## 2016-10-09 DIAGNOSIS — Z8619 Personal history of other infectious and parasitic diseases: Secondary | ICD-10-CM | POA: Diagnosis not present

## 2016-10-09 DIAGNOSIS — I8390 Asymptomatic varicose veins of unspecified lower extremity: Secondary | ICD-10-CM

## 2016-10-09 DIAGNOSIS — E669 Obesity, unspecified: Secondary | ICD-10-CM | POA: Diagnosis not present

## 2016-10-09 LAB — LIPID PANEL
CHOLESTEROL: 165 mg/dL (ref 0–200)
HDL: 50.4 mg/dL (ref >39.00)
LDL CALC: 94 mg/dL (ref 0–99)
NONHDL: 114.74
Total CHOL/HDL Ratio: 3
Triglycerides: 106 mg/dL (ref 0.0–149.0)
VLDL: 21.2 mg/dL (ref 0.0–40.0)

## 2016-10-09 LAB — COMPREHENSIVE METABOLIC PANEL
ALBUMIN: 4.2 g/dL (ref 3.5–5.2)
ALT: 19 U/L (ref 0–53)
AST: 18 U/L (ref 0–37)
Alkaline Phosphatase: 57 U/L (ref 39–117)
BUN: 16 mg/dL (ref 6–23)
CO2: 28 mEq/L (ref 19–32)
Calcium: 9 mg/dL (ref 8.4–10.5)
Chloride: 105 mEq/L (ref 96–112)
Creatinine, Ser: 1.02 mg/dL (ref 0.40–1.50)
GFR: 80.86 mL/min (ref >60.00)
GLUCOSE: 95 mg/dL (ref 70–99)
Potassium: 4.4 mEq/L (ref 3.5–5.1)
Sodium: 139 mEq/L (ref 135–145)
Total Bilirubin: 0.7 mg/dL (ref 0.2–1.2)
Total Protein: 6.2 g/dL (ref 6.0–8.3)

## 2016-10-09 LAB — CBC
HCT: 45.8 % (ref 39.0–52.0)
Hemoglobin: 15.3 g/dL (ref 13.0–17.0)
MCHC: 33.5 g/dL (ref 30.0–36.0)
MCV: 95.6 fl (ref 78.0–100.0)
Platelets: 285 10*3/uL (ref 150.0–400.0)
RBC: 4.79 Mil/uL (ref 4.22–5.81)
RDW: 13.9 % (ref 11.5–15.5)
WBC: 5.7 10*3/uL (ref 4.0–10.5)

## 2016-10-09 LAB — PSA: PSA: 0.54 ng/mL (ref 0.10–4.00)

## 2016-10-09 LAB — TSH: TSH: 3.91 u[IU]/mL (ref 0.35–4.50)

## 2016-10-09 NOTE — Assessment & Plan Note (Signed)
C/o lightheadedness very infrequently and drinks a good deal of iced tea and some alcohol. Encouraged 66 of clear fluids plus more and check labs report worsening symptoms. Has nausea at times a couple hours after lunch, consider low sugar check labs. Encouraged to pay attention carb intake

## 2016-10-09 NOTE — Assessment & Plan Note (Signed)
Patient encouraged to maintain heart healthy diet, regular exercise, adequate sleep. Consider daily probiotics. Take medications as prescribed 

## 2016-10-09 NOTE — Assessment & Plan Note (Signed)
Right leg is notable but asymptomatic

## 2016-10-09 NOTE — Progress Notes (Signed)
Subjective:  I acted as a Neurosurgeonscribe for Dr. Abner GreenspanBlyth. [name], [place]  Patient ID: Jimmy Werner, male    DOB: 08/04/1960, 56 y.o.   MRN: [id number]  No chief complaint on file.   HPI Patient her for annual preventative exam. PatienC/o lightheadedness very infrequently and drinks a good deal of iced tea and some alcohol. Has nausea at times a couple hours after lunch, consider low sugar check labs. Encouraged to pay attention carb intaket is in today for an annual exam. He feels well today, no recent febrile illness or trips to ER. Denies CP/palp/SOB/HA/congestion/fevers or GU c/o. Taking meds as prescribed Patient Care Team: Jimmy CanaryBlyth, Jimmy A, MD as PCP - General (Family Medicine)   Past Medical History:  Diagnosis Date  . Chicken pox as a child  . History of chicken pox   . Hyperlipidemia 12/23/2013  . Obesity 12/23/2013  . Preventative health care 12/23/2013  . RUQ pain 12/23/2013  . Varicose veins 12/23/2013    Past Surgical History:  Procedure Laterality Date  . APPENDECTOMY  1979  . TONSILLECTOMY  1969  . WISDOM TOOTH EXTRACTION  1989    Family History  Problem Relation Age of Onset  . Heart disease Mother   . Stroke Mother   . Arthritis Mother   . Factor V Leiden deficiency Mother   . Cancer Father        prostate  . Arthritis Father   . Factor V Leiden deficiency Sister   . Factor V Leiden deficiency Brother   . Deep vein thrombosis Brother   . Parkinson's disease Paternal Grandmother   . Factor V Leiden deficiency Sister   . Drug abuse Brother   . Factor V Leiden deficiency Brother   . Deep vein thrombosis Brother   . Deep vein thrombosis Brother   . Learning disabilities Maternal Aunt     Social History   Social History  . Marital status: Married    Spouse name: N/A  . Number of children: N/A  . Years of education: N/A   Occupational History  . Not on file.   Social History Main Topics  . Smoking status: Never Smoker  . Smokeless tobacco: Never Used    . Alcohol use Yes     Comment: 6 beers weekly  . Drug use: No  . Sexual activity: Yes    Partners: Female     Comment: lives with wife, works as an Merchandiser, retailnvestment Consultant, no dietary restrictions   Other Topics Concern  . Not on file   Social History Narrative  . No narrative on file    Outpatient Medications Prior to Visit  Medication Sig Dispense Refill  . amoxicillin-clavulanate (AUGMENTIN) 875-125 MG tablet Take 1 tablet by mouth 2 (two) times daily. 20 tablet 0  . oxyCODONE-acetaminophen (ROXICET) 5-325 MG tablet Take 1 tablet by mouth every 8 (eight) hours as needed for severe pain. 20 tablet 0   No facility-administered medications prior to visit.     No Known Allergies  Review of Systems  Constitutional: Negative for chills, fever and malaise/fatigue.  HENT: Negative for congestion and hearing loss.   Eyes: Negative for discharge.  Respiratory: Negative for cough, sputum production and shortness of breath.   Cardiovascular: Negative for chest pain, palpitations and leg swelling.  Gastrointestinal: Positive for abdominal pain and nausea. Negative for blood in stool, constipation, diarrhea, heartburn and vomiting.  Genitourinary: Negative for dysuria, frequency, hematuria and urgency.  Musculoskeletal: Negative for back pain, falls and  myalgias.  Skin: Negative for rash.  Neurological: Positive for dizziness. Negative for sensory change, loss of consciousness, weakness and headaches.  Endo/Heme/Allergies: Negative for environmental allergies. Does not bruise/bleed easily.  Psychiatric/Behavioral: Negative for depression and suicidal ideas. The patient is not nervous/anxious and does not have insomnia.        Objective:    Physical Exam  Constitutional: He is oriented to person, place, and time. He appears well-developed and well-nourished. No distress.  HENT:  Head: Normocephalic and atraumatic.  Eyes: Conjunctivae are normal.  Neck: Neck supple. No thyromegaly  present.  Cardiovascular: Normal rate, regular rhythm and normal heart sounds.   No murmur heard. Pulmonary/Chest: Effort normal and breath sounds normal. No respiratory distress. He has no wheezes.  Abdominal: Soft. Bowel sounds are normal. He exhibits no mass. There is no tenderness.  Musculoskeletal: He exhibits no edema.  Lymphadenopathy:    He has no cervical adenopathy.  Neurological: He is alert and oriented to person, place, and time.  Skin: Skin is warm and dry.  Psychiatric: He has a normal mood and affect. His behavior is normal.    BP 102/70 (BP Location: Left Arm, Patient Position: Sitting, Cuff Size: Normal)   Pulse (!) 53   Temp 97.7 F (36.5 C) (Oral)   Resp 18   Ht 6\' 6"  (1.981 m)   Wt 260 lb 9.6 oz (118.2 kg)   SpO2 97%   BMI 30.12 kg/m  Wt Readings from Last 3 Encounters:  10/09/16 260 lb 9.6 oz (118.2 kg)  12/21/15 265 lb 2 oz (120.3 kg)  07/20/14 255 lb (115.7 kg)   BP Readings from Last 3 Encounters:  10/09/16 102/70  12/21/15 120/72  07/20/14 104/61      There is no immunization history on file for this patient.  Health Maintenance  Topic Date Due  . Hepatitis C Screening  [birth year]  . HIV Screening  08/28/1977  . TETANUS/TDAP  08/28/1981  . COLONOSCOPY  08/28/2012  . INFLUENZA VACCINE  01/15/2017 (Originally 09/27/2016)    Lab Results  Component Value Date   WBC 7.7 07/20/2014   HGB 15.3 07/20/2014   HCT 45.2 07/20/2014   PLT 282 07/20/2014   GLUCOSE 93 07/20/2014   ALT 23 07/20/2014   AST 16 07/20/2014   NA 136 07/20/2014   K 4.0 07/20/2014   CL 106 07/20/2014   CREATININE 1.04 07/20/2014   BUN 16 07/20/2014   CO2 26 07/20/2014   TSH 2.48 12/23/2013    Lab Results  Component Value Date   TSH 2.48 12/23/2013   Lab Results  Component Value Date   WBC 7.7 07/20/2014   HGB 15.3 07/20/2014   HCT 45.2 07/20/2014   MCV 92.4 07/20/2014   PLT 282 07/20/2014   Lab Results  Component Value Date   NA 136 07/20/2014   K 4.0  07/20/2014   CO2 26 07/20/2014   GLUCOSE 93 07/20/2014   BUN 16 07/20/2014   CREATININE 1.04 07/20/2014   BILITOT 0.5 07/20/2014   ALKPHOS 51 07/20/2014   AST 16 07/20/2014   ALT 23 07/20/2014   PROT 6.2 (L) 07/20/2014   ALBUMIN 3.5 07/20/2014   CALCIUM 8.2 (L) 07/20/2014   ANIONGAP 4 (L) 07/20/2014   GFR 85.50 04/08/2014   No results found for: CHOL No results found for: HDL No results found for: LDLCALC No results found for: TRIG No results found for: CHOLHDL No results found for: ZOXW9U       Assessment &  Plan:   Problem List Items Addressed This Visit    History of chicken pox    Encouraged to consider Shingrix       Varicose vein of leg    Right leg is notable but asymptomatic      RUQ pain    Is following with [facility] and is improved. No concerns on work up.      Obesity    Encouraged DASH diet, decrease po intake and increase exercise as tolerated. Needs 7-8 hours of sleep nightly. Avoid trans fats, eat small, frequent meals every 4-5 hours with lean proteins, complex carbs and healthy fats. Minimize simple carbs, consider bariatric      Hyperlipidemia    Encouraged heart healthy diet, increase exercise, avoid trans fats, consider a krill oil cap daily      Preventative health care    Patient encouraged to maintain heart healthy diet, regular exercise, adequate sleep. Consider daily probiotics. Take medications as prescribed      Relevant Orders   CBC   Comprehensive metabolic panel   Lipid panel   TSH   Chronic RUQ pain    Rare and less painful, negative work up         I have discontinued Mr. Ropp's oxyCODONE-acetaminophen and amoxicillin-clavulanate.  No orders of the defined types were placed in this encounter.   CMA served as Neurosurgeon during this visit. History, Physical and Plan performed by medical provider. Documentation and orders reviewed and attested to.  Danise Edge, MD

## 2016-10-09 NOTE — Assessment & Plan Note (Signed)
Encouraged heart healthy diet, increase exercise, avoid trans fats, consider a krill oil cap daily 

## 2016-10-09 NOTE — Assessment & Plan Note (Signed)
Encouraged to consider Shingrix

## 2016-10-09 NOTE — Assessment & Plan Note (Signed)
Is following with [facility] and is improved. No concerns on work up.

## 2016-10-09 NOTE — Patient Instructions (Addendum)
NOW probiotic daily 10 strain probiotic daily, Benefiber or Metamucil daily Shingrix new shingles shot 2 shots over 6 months.  64 oz of clear fluids daily, balance with more for caffeine and alcohol intake   Preventive Care 40-64 Years, Male Preventive care refers to lifestyle choices and visits with your health care provider that can promote health and wellness. What does preventive care include?  A yearly physical exam. This is also called an annual well check.  Dental exams once or twice a year.  Routine eye exams. Ask your health care provider how often you should have your eyes checked.  Personal lifestyle choices, including: ? Daily care of your teeth and gums. ? Regular physical activity. ? Eating a healthy diet. ? Avoiding tobacco and drug use. ? Limiting alcohol use. ? Practicing safe sex. ? Taking low-dose aspirin every day starting at age 54. What happens during an annual well check? The services and screenings done by your health care provider during your annual well check will depend on your age, overall health, lifestyle risk factors, and family history of disease. Counseling Your health care provider may ask you questions about your:  Alcohol use.  Tobacco use.  Drug use.  Emotional well-being.  Home and relationship well-being.  Sexual activity.  Eating habits.  Work and work Statistician.  Screening You may have the following tests or measurements:  Height, weight, and BMI.  Blood pressure.  Lipid and cholesterol levels. These may be checked every 5 years, or more frequently if you are over 50 years old.  Skin check.  Lung cancer screening. You may have this screening every year starting at age 54 if you have a 30-pack-year history of smoking and currently smoke or have quit within the past 15 years.  Fecal occult blood test (FOBT) of the stool. You may have this test every year starting at age 54.  Flexible sigmoidoscopy or colonoscopy. You  may have a sigmoidoscopy every 5 years or a colonoscopy every 10 years starting at age 54.  Prostate cancer screening. Recommendations will vary depending on your family history and other risks.  Hepatitis C blood test.  Hepatitis B blood test.  Sexually transmitted disease (STD) testing.  Diabetes screening. This is done by checking your blood sugar (glucose) after you have not eaten for a while (fasting). You may have this done every 1-3 years.  Discuss your test results, treatment options, and if necessary, the need for more tests with your health care provider. Vaccines Your health care provider may recommend certain vaccines, such as:  Influenza vaccine. This is recommended every year.  Tetanus, diphtheria, and acellular pertussis (Tdap, Td) vaccine. You may need a Td booster every 10 years.  Varicella vaccine. You may need this if you have not been vaccinated.  Zoster vaccine. You may need this after age 54.  Measles, mumps, and rubella (MMR) vaccine. You may need at least one dose of MMR if you were born in 1957 or later. You may also need a second dose.  Pneumococcal 13-valent conjugate (PCV13) vaccine. You may need this if you have certain conditions and have not been vaccinated.  Pneumococcal polysaccharide (PPSV23) vaccine. You may need one or two doses if you smoke cigarettes or if you have certain conditions.  Meningococcal vaccine. You may need this if you have certain conditions.  Hepatitis A vaccine. You may need this if you have certain conditions or if you travel or work in places where you may be exposed to hepatitis  A.  Hepatitis B vaccine. You may need this if you have certain conditions or if you travel or work in places where you may be exposed to hepatitis B.  Haemophilus influenzae type b (Hib) vaccine. You may need this if you have certain risk factors.  Talk to your health care provider about which screenings and vaccines you need and how often you  need them. This information is not intended to replace advice given to you by your health care provider. Make sure you discuss any questions you have with your health care provider. Document Released: 03/12/2015 Document Revised: 11/03/2015 Document Reviewed: 12/15/2014 Elsevier Interactive Patient Education  2017 [employer].

## 2016-10-09 NOTE — Assessment & Plan Note (Signed)
Rare and less painful, negative work up

## 2016-10-09 NOTE — Assessment & Plan Note (Signed)
Encouraged DASH diet, decrease po intake and increase exercise as tolerated. Needs 7-8 hours of sleep nightly. Avoid trans fats, eat small, frequent meals every 4-5 hours with lean proteins, complex carbs and healthy fats. Minimize simple carbs, consider bariatric

## 2017-05-11 ENCOUNTER — Encounter: Payer: Self-pay | Admitting: Family Medicine

## 2017-10-11 ENCOUNTER — Encounter: Payer: BLUE CROSS/BLUE SHIELD | Admitting: Family Medicine

## 2023-05-25 ENCOUNTER — Other Ambulatory Visit (HOSPITAL_BASED_OUTPATIENT_CLINIC_OR_DEPARTMENT_OTHER): Payer: Self-pay

## 2023-05-25 ENCOUNTER — Emergency Department (HOSPITAL_BASED_OUTPATIENT_CLINIC_OR_DEPARTMENT_OTHER): Admission: EM | Admit: 2023-05-25 | Discharge: 2023-05-25 | Disposition: A | Discharge status: Home / Self Care

## 2023-05-25 ENCOUNTER — Other Ambulatory Visit: Payer: Self-pay

## 2023-05-25 ENCOUNTER — Encounter (HOSPITAL_BASED_OUTPATIENT_CLINIC_OR_DEPARTMENT_OTHER): Payer: Self-pay | Admitting: Emergency Medicine

## 2023-05-25 DIAGNOSIS — R509 Fever, unspecified: Secondary | ICD-10-CM | POA: Insufficient documentation

## 2023-05-25 DIAGNOSIS — R5383 Other fatigue: Secondary | ICD-10-CM | POA: Insufficient documentation

## 2023-05-25 DIAGNOSIS — R110 Nausea: Secondary | ICD-10-CM | POA: Insufficient documentation

## 2023-05-25 DIAGNOSIS — R197 Diarrhea, unspecified: Secondary | ICD-10-CM | POA: Diagnosis not present

## 2023-05-25 DIAGNOSIS — R944 Abnormal results of kidney function studies: Secondary | ICD-10-CM | POA: Insufficient documentation

## 2023-05-25 LAB — BASIC METABOLIC PANEL WITH GFR
Anion gap: 9 (ref 5–15)
BUN: 12 mg/dL (ref 6–20)
CO2: 22 mmol/L (ref 22–32)
Calcium: 9 mg/dL (ref 8.9–10.3)
Chloride: 105 mmol/L (ref 98–111)
Creatinine, Ser: 1.36 mg/dL — ABNORMAL HIGH (ref 0.61–1.24)
GFR, Estimated: 60 mL/min — ABNORMAL LOW (ref >60)
Glucose, Bld: 89 mg/dL (ref 70–99)
Potassium: 4.3 mmol/L (ref 3.5–5.1)
Sodium: 136 mmol/L (ref 135–145)

## 2023-05-25 LAB — URINALYSIS, ROUTINE W REFLEX MICROSCOPIC
Bacteria, UA: NONE SEEN
Glucose, UA: NEGATIVE mg/dL
Hgb urine dipstick: NEGATIVE
Ketones, ur: NEGATIVE mg/dL
Leukocytes,Ua: NEGATIVE
Nitrite: NEGATIVE
Protein, ur: 30 mg/dL — AB
Specific Gravity, Urine: 1.038 — ABNORMAL HIGH (ref 1.005–1.030)
pH: 5.5 (ref 5.0–8.0)

## 2023-05-25 LAB — CBC
HCT: 48.7 % (ref 39.0–52.0)
Hemoglobin: 16.7 g/dL (ref 13.0–17.0)
MCH: 31.5 pg (ref 26.0–34.0)
MCHC: 34.3 g/dL (ref 30.0–36.0)
MCV: 91.7 fL (ref 80.0–100.0)
Platelets: 220 10*3/uL (ref 150–400)
RBC: 5.31 MIL/uL (ref 4.22–5.81)
RDW: 13.2 % (ref 11.5–15.5)
WBC: 8.3 10*3/uL (ref 4.0–10.5)
nRBC: 0 % (ref 0.0–0.2)

## 2023-05-25 MED ORDER — ONDANSETRON 4 MG PO TBDP
4.0000 mg | ORAL_TABLET | Freq: Three times a day (TID) | ORAL | 0 refills | Status: AC | PRN
  Filled 2023-05-25: qty 20, 7d supply, fill #0

## 2023-05-25 MED ORDER — ONDANSETRON 4 MG PO TBDP
4.0000 mg | ORAL_TABLET | Freq: Once | ORAL | Status: AC
  Administered 2023-05-25: 4 mg via ORAL
  Filled 2023-05-25: qty 1

## 2023-05-25 MED ORDER — ONDANSETRON HCL 4 MG PO TABS: 4.0000 mg | ORAL_TABLET | Freq: Three times a day (TID) | ORAL | Status: DC | PRN

## 2023-05-25 NOTE — ED Triage Notes (Signed)
 Pt c/o repetitive fevers x 3 weeks, recent dx with flu. Also endorses fatigue and weakness. Diarrhea that has resolved

## 2023-05-25 NOTE — ED Notes (Signed)
Pt given water.  Tolerated well 

## 2023-05-25 NOTE — ED Provider Notes (Signed)
 [place] [facility]GENC[facility] DEPARTMENT AT [facility] Provider Note   CSN: [id number] Arrival date & time: 05/25/23  1229     History  Chief Complaint  Patient presents with   Fatigue    Jimmy Werner is a 63 y.o. male.  HPI Patient is a 63 year old male presents ED today complaining of fatigue x 3 weeks and diarrhea x 2 days.  States that he was recently diagnosed with influenza A 2 weeks ago and was provided azithromycin and prednisone which helped.  But since then had developed a fever with a Tmax of 101.79F 3 days ago and diarrhea.  Reports not drinking adequate fluid due to nausea but denies vomiting.  Fever has since resolved.  Denies current fever, headache, vision changes, chest pain, shortness of breath, abdominal pain, vomiting, dysuria, melena, hematochezia, lower leg swelling.      Home Medications Prior to Admission medications   Medication Sig Start Date End Date Taking? Authorizing Provider  ondansetron (ZOFRAN) 4 MG tablet Take 1 tablet (4 mg total) by mouth every 8 (eight) hours as needed for nausea or vomiting. 05/25/23  Yes Lunette Stands, PA-C      Allergies    Rosuvastatin    Review of Systems   Review of Systems  Constitutional:  Positive for fatigue.  Gastrointestinal:  Positive for diarrhea.  All other systems reviewed and are negative.   Physical Exam Updated Vital Signs BP 127/83   Pulse 69   Temp 98 F (36.7 C)   Resp 18   Wt 117.9 kg   SpO2 94%   BMI 30.05 kg/m  Physical Exam Vitals and nursing note reviewed.  Constitutional:      General: He is not in acute distress.    Appearance: Normal appearance. He is not ill-appearing.  HENT:     Head: Normocephalic and atraumatic.  Eyes:     General: No scleral icterus.       Right eye: No discharge.        Left eye: No discharge.     Extraocular Movements: Extraocular movements intact.     Conjunctiva/sclera: Conjunctivae normal.  Cardiovascular:     Rate and Rhythm: Normal rate  and regular rhythm.     Pulses: Normal pulses.     Heart sounds: Normal heart sounds. No murmur heard.    No friction rub. No gallop.  Pulmonary:     Effort: Pulmonary effort is normal. No respiratory distress.     Breath sounds: Normal breath sounds. No stridor. No wheezing, rhonchi or rales.  Chest:     Chest wall: No tenderness.  Abdominal:     General: Abdomen is flat. There is no distension.     Palpations: Abdomen is soft.     Tenderness: There is no abdominal tenderness. There is no right CVA tenderness, left CVA tenderness, guarding or rebound.  Musculoskeletal:        General: No deformity.     Cervical back: Normal range of motion. No rigidity.     Right lower leg: No edema.     Left lower leg: No edema.  Lymphadenopathy:     Cervical: No cervical adenopathy.  Skin:    General: Skin is warm and dry.     Coloration: Skin is not pale.     Findings: No bruising, erythema or lesion.  Neurological:     General: No focal deficit present.     Mental Status: He is alert and oriented to person, place, and time.  Mental status is at baseline.     Sensory: No sensory deficit.     Motor: No weakness.     Coordination: Coordination normal.     Gait: Gait normal.  Psychiatric:        Mood and Affect: Mood normal.     ED Results / Procedures / Treatments   Labs (all labs ordered are listed, but only abnormal results are displayed) Labs Reviewed  BASIC METABOLIC PANEL WITH GFR - Abnormal; Notable for the following components:      Result Value   Creatinine, Ser 1.36 (*)    GFR, Estimated 60 (*)    All other components within normal limits  URINALYSIS, ROUTINE W REFLEX MICROSCOPIC - Abnormal; Notable for the following components:   Specific Gravity, Urine 1.038 (*)    Bilirubin Urine SMALL (*)    Protein, ur 30 (*)    All other components within normal limits  CBC  CBG MONITORING, ED    EKG EKG Interpretation Date/Time:  Friday May 25 2023 13:23:53 EDT Ventricular  Rate:  85 PR Interval:  144 QRS Duration:  82 QT Interval:  388 QTC Calculation: 461 R Axis:   -2  Text Interpretation: Normal sinus rhythm Inferior infarct , age undetermined Abnormal ECG No previous ECGs available Confirmed by Linwood Dibbles [phone]) on 05/25/2023 4:47:23 PM  Radiology No results found.  Procedures Procedures    Medications Ordered in ED Medications  ondansetron (ZOFRAN-ODT) disintegrating tablet 4 mg (4 mg Oral Given 05/25/23 1634)    ED Course/ Medical Decision Making/ A&P                                 Medical Decision Making Amount and/or Complexity of Data Reviewed Labs: ordered.   This patient is a 63 year old male who presents to the ED for concern of repetitive fevers x 3 weeks with recent diagnosis of flu, concerned about fatigue and weakness and diarrhea.  With him reporting not having drink adequate amount of fluids due to intermittent nausea, will have him p.o. challenge here and provided ODT Zofran.  Suspect possible postviral sequela with new gastritis.  On physical exam, patient is noted to be afebrile, no acute distress, speaking full sentences.  He is able to ambulate that difficulty.  No abdominal tenderness noted on exam.  Unremarkable exam.  Labs showed elevated creatinine of 1.36 and decreased GFR of 60, likely due to diarrhea combined with inadequate fluid intake.  Protein is also noted in the urine with elevated specific gravity which further raises my suspicion that dehydration is the cause for elevated creatinine.  Labs are otherwise unremarkable.  On reevaluation, patient is able to tolerate p.o. intake with both water and food after ODT Zofran.  Symptoms have resolved.  Believe that fatigue is most likely due to postviral and low suspicion for cardiac pulmonary causes.  Will send home with Zofran and have him follow-up with PCP in 1 week for repeat labs to check kidney function and further evaluate causes of fatigue.  Suspicion for any  emergent pathology present this time.  I believe patient safe discharge at this time.  Patient quest agreement and understanding of plan.  Provided return to ED precautions.  All questions were answered.  Differential diagnoses prior to evaluation: The emergent differential diagnosis includes, but is not limited to, gastritis, gastroenteritis, diverticulitis, pancreatitis, ACS, PE, URI, pneumonia, sinusitis, AKI, dehydration, metabolic disturbance, hypothyroidism, hyperthyroidism. This is  not an exhaustive differential.   Past Medical History / Co-morbidities / Social History: Hyperlipidemia  Additional history: Chart reviewed. Pertinent results include:   Seen via e-visit on 05/10/2023 for acute bacterial sinusitis and was prescribed Medrol Dosepak and Z-Pak.  Lab Tests/Imaging studies: I personally interpreted labs/imaging and the pertinent results include: CBC unremarkable BMP shows elevated creatinine 1.36 and decreased GFR of 60 UA shows elevated specific gravity and proteins.  Continued imaging of both chest and abdomen however do not believe is warranted ascites currently patient is not experiencing any pain and vitals are stable.  Cardiac monitoring: EKG obtained and interpreted by myself and attending physician which shows: NSR   Medications: I ordered medication including Zofran.  I have reviewed the patients home medicines and have made adjustments as needed.  Disposition: After consideration of the diagnostic results and the patients response to treatment, I feel that the patient would benefit from treatment noted as above and discharg.   emergency department workup does not suggest an emergent condition requiring admission or immediate intervention beyond what has been performed at this time. The plan is: Zofran for nausea, follow-up with PCP in 1 week for repeat labs, return for any new or worsening symptoms. The patient is safe for discharge and has been instructed to  return immediately for worsening symptoms, change in symptoms or any other concerns.   Final Clinical Impression(s) / ED Diagnoses Final diagnoses:  Fatigue, unspecified type  Diarrhea, unspecified type    Rx / DC Orders ED Discharge Orders          Ordered    ondansetron (ZOFRAN) 4 MG tablet  Every 8 hours PRN        05/25/23 1654              Lunette Stands, [place] 05/25/23 1654    Linwood Dibbles, MD 05/28/23 [phone]

## 2023-05-25 NOTE — Discharge Instructions (Addendum)
 You were seen today for increased fatigue, diarrhea.  Your labs and physical exam were very reassuring that I have low suspicion for emergent pathology present at this time.  With you able to tolerate eating and drinking, recommend you continue with hydration as your kidney function was slightly increased.  This is most likely due to dehydration.  Will have you follow-up with PCP in 1 week for repeat labs and evaluation of fatigue.  Return to the ED for any new or worsening symptoms including fever, chest pain, shortness of breath, abdominal pain, vomiting, lower leg swelling, confusion, seizure.
